# Patient Record
Sex: Male | Born: 1995 | Race: White | Hispanic: No | Marital: Single
Health system: Midwestern US, Community
[De-identification: ages and names within clinical notes are randomized; demographics above are authoritative.]

## PROBLEM LIST (undated history)

## (undated) DIAGNOSIS — F209 Schizophrenia, unspecified: Secondary | ICD-10-CM

## (undated) DIAGNOSIS — G8929 Other chronic pain: Secondary | ICD-10-CM

## (undated) DIAGNOSIS — F1210 Cannabis abuse, uncomplicated: Secondary | ICD-10-CM

## (undated) DIAGNOSIS — J45909 Unspecified asthma, uncomplicated: Secondary | ICD-10-CM

## (undated) HISTORY — DX: Unspecified asthma, uncomplicated: J45.909

## (undated) HISTORY — DX: Schizophrenia, unspecified: F20.9

## (undated) HISTORY — DX: Other chronic pain: G89.29

## (undated) HISTORY — DX: Cannabis abuse, uncomplicated: F12.10

## (undated) HISTORY — DX: Schizophrenia, unspecified (CMS HCC): F20.9

---
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
## 2015-06-04 ENCOUNTER — Ambulatory Visit (INDEPENDENT_AMBULATORY_CARE_PROVIDER_SITE_OTHER): Payer: BC Managed Care – PPO | Admitting: Physician Assistant

## 2015-06-04 ENCOUNTER — Encounter (INDEPENDENT_AMBULATORY_CARE_PROVIDER_SITE_OTHER): Payer: Self-pay | Admitting: Physician Assistant

## 2015-06-04 VITALS — BP 120/64 | HR 64 | Temp 98.2°F | Resp 16 | Ht 68.9 in | Wt 141.9 lb

## 2015-06-04 DIAGNOSIS — J069 Acute upper respiratory infection, unspecified: Secondary | ICD-10-CM

## 2015-06-04 DIAGNOSIS — J4520 Mild intermittent asthma, uncomplicated: Secondary | ICD-10-CM

## 2015-06-04 DIAGNOSIS — H1032 Unspecified acute conjunctivitis, left eye: Secondary | ICD-10-CM

## 2015-06-04 HISTORY — DX: Mild intermittent asthma, uncomplicated: J45.20

## 2015-06-04 MED ORDER — SULFACETAMIDE SODIUM 10 % OP SOLN
2.0000 [drp] | Freq: Four times a day (QID) | OPHTHALMIC | Status: DC
Start: ? — End: 2015-06-04

## 2015-06-04 MED ORDER — AZITHROMYCIN 250 MG PO TABS
ORAL_TABLET | ORAL | Status: DC
Start: ? — End: 2015-06-04

## 2015-06-04 NOTE — Progress Notes (Signed)
Subjective: Kirk Ward is a 20 y.o. male who presents today as a new patient.    Pt complains of the following problem(S): respiratory and eye symptoms.      Respiratory symptoms-   Onset of sx - 1 week ago  Head congestion - yes  Chest congestion - yes  Nasal congestion - yes  Runny nose - yes   Color - yellow  Sinus pressure - yes   Teeth hurt - no  Sore throat - slight discomfort from post nasal drip  Ear pain/pressure - no  Cough - yes  Sputum production - not really although slightly today   Color - yellow  Chest pain w/ coughing - not today although he did  Wheezing - no  Fever/chills - feverish feeling when symptoms started although temp not checked  Decreased appetite - no  Arthralgias - yes  Exposure to strep/flu - no  Others with similar symptoms - no  Meds used - Ibuprofen, Tylenol, Robitussin    Eye symptoms-   Onset of symptoms - 4 days ago  Affected eye - left  Vision changes - no  Sclera redness - yes  Drainage - yes   Color - mucous colored   Matting - yes  Foreign body sensation - no  Cold symptoms - yes; see above  Skin rash - no    PMH:    has a past medical history of Mild intermittent asthma without complication (06/04/2015).    PSH:   has no past surgical history on file.    Allergies:   No Known Allergies     Medications:   No current outpatient prescriptions on file.    Social Hx:    reports that he has never smoked. He has never used smokeless tobacco. He reports that he drinks alcohol. He reports that he does not use illicit drugs.    Family Hx:   family history includes No known problems in his brother, brother, father, mother, and sister.    ROS:   Gen: not feeling well  HEENT:  As above  Neck:  no complaints  Heart:  no complaints  Lungs:  As above  GI:  no complaints  GU:  no complaints  Ext:  no complaints  MS:  no complaints  Neuro:  no complaints  Psych:  no complaints    Objective: Vital signs: BP 120/64 mmHg  Pulse 64  Temp(Src) 98.2 F (36.8 C)  Resp 16  Ht 1.75 m (5' 8.9")   Wt 64.365 kg (141 lb 14.4 oz)  BMI 21.02 kg/m2  SpO2 97%    Gen: well nourished male in no acute distress  Head: atraumatic, normal cephalic  Eyes: Location - L eye    Description - Sclera red and injected.  PERRL.  EOMI.  No foreign bodies visible.     Location - R eye    Description - Sclera white.  PERRL.  EOMI.    Ears: canals patent, TM's B/L gray with diminished landmarks, full appearing.    Nose: Turbinates pink, slight swelling, no drainage  Mouth: no lesions or masses noted  Dentition: good  Throat: no lesions, no masses, no post nasal drip noted  Sinuses: non tender to palpation over the frontal and maxillary sinuses  Neck: supple, adenopathy noted, no thyroidmegaly or nodules noted, no JVD or bruits noted   Heart: regular, no murmurs or ectopy  Lungs: CTA, breath sounds probably normal for pt  Abd: soft, bowel sounds normal, non tender,  no organomegaly or masses palpable  Ext: no clubbing, no cyanosis, no edema  MS: no gross abnormalities noted  Neuro: A/O x 3; CN 2-12 grossly intact    Gait: steady  Psych: pleasant and cooperative    Assessment:  1. Upper respiratory tract infection, unspecified type    2. Acute bacterial conjunctivitis of left eye        Plan:  Start med(s) as listed below.    Patient instructed in med(s), side effects and s/s to call.    Patient instructed in symptomatic measures.    Patient instructed to call if no improvement.    Work excuse for 06-04-15 and 06-05-15    Patient instructed to call if any problems or concerns.  Patient asked if they had any questions or concerns.   Patient reported that all of their questions were answered and that they had no concerns.    Requested Prescriptions     Signed Prescriptions Disp Refills   . azithromycin (ZITHROMAX Z-PAK) 250 MG tablet 6 tablet 0     Sig: 2 po today then 1 po qd x 4 d   . sulfacetamide (BLEPH-10) 10 % ophthalmic solution 5 mL 0     Sig: Place 2 drops into the left eye 4 (four) times daily.       No orders of the  defined types were placed in this encounter.       There are no Patient Instructions on file for this visit.    Adjust med list to what pt is taking    Quality Measures:  Tobacco Dependence:  History   Smoking status   . Never Smoker    Smokeless tobacco   . Never Used     Counseling given: Not Answered    F/U:   Return if symptoms worsen or fail to improve.    Supervising physician - Dr. Memory Argue A. Lovelace, DO

## 2015-06-14 ENCOUNTER — Encounter (INDEPENDENT_AMBULATORY_CARE_PROVIDER_SITE_OTHER): Payer: Self-pay | Admitting: Physician Assistant

## 2015-06-14 ENCOUNTER — Ambulatory Visit (INDEPENDENT_AMBULATORY_CARE_PROVIDER_SITE_OTHER): Payer: BC Managed Care – PPO | Admitting: Physician Assistant

## 2015-06-14 VITALS — BP 116/62 | HR 90 | Temp 97.6°F | Resp 16 | Ht 68.9 in | Wt 145.4 lb

## 2015-06-14 DIAGNOSIS — Z0000 Encounter for general adult medical examination without abnormal findings: Secondary | ICD-10-CM

## 2015-06-14 LAB — VH POCT URINALYSIS (DIPSTICK)
Bilirubin, UA POCT: NEGATIVE
Blood, UA POCT: NEGATIVE
Glucose, UA POCT: NEGATIVE mg/dL
Ketones, UA POCT: NEGATIVE mg/dL
Nitrite, UA POCT: NEGATIVE
POCT Leukocytes, UA: NEGATIVE
POCT Spec Gravity, UA: 1.03 (ref 1.001–1.035)
POCT pH, UA: 6 (ref 5–8)
Protein, UA POCT: NEGATIVE mg/dL
Urobilinogen, UA: 0.2 mg/dL

## 2015-06-14 NOTE — Progress Notes (Signed)
Subjective: Kirk Ward is a 20 y.o. male who presents today for a physical.    Pt denies any problems     PMH:    has a past medical history of Mild intermittent asthma without complication (06/04/2015).    PSH:   has no past surgical history on file.    Allergies:   No Known Allergies     Medications:   No current outpatient prescriptions on file.    Social Hx:    reports that he has never smoked. He has never used smokeless tobacco. He reports that he drinks alcohol. He reports that he does not use illicit drugs.    Family Hx:   family history includes No known problems in his brother, brother, father, mother, and sister.    ROS:  Gen: no complaints  HEENT:  no complaints  Neck:  no complaints  Heart:  no complaints  Lungs:  no complaints  GI:  no complaints  GU:  no complaints  Ext:  no complaints  MS:  no complaints  Skin:  no complaints  Neuro:  no complaints  Psych:  no complaints    Objective: Vital signs: BP 116/62 mmHg  Pulse 90  Temp(Src) 97.6 F (36.4 C) (Oral)  Resp 16  Ht 1.75 m (5' 8.9")  Wt 65.953 kg (145 lb 6.4 oz)  BMI 21.54 kg/m2  SpO2 99%     Visual Acuity Screening    Right eye Left eye Both eyes   Without correction: 20/13 20/13 20/13    With correction:          Gen: Well nourished male in no acute distress  Head: atraumatic, normal cephalic, no abnormality noted  Eyes: sclera white, EOMI intact  Ears: canals patent, tm's pearl gray  Nose: patent  Mouth: no lesions or masses noted  Dentition: good  Throat: patent, no postnasal drip noted  Neck: supple, no adenopathy, no thyroidmegaly or masses noted, no JVD or bruits noted  Heart: regular, no murmurs or ectopy  Lungs: CTA, breath sounds normal for pt  Chest: symmetrical, no use of accessory muscles  Abd: soft, bowel sounds normal, non tender, no organomegaly or masses palpable  Ext: no clubbing cyanosis or edema  MS: no gross abnormalities noted  Neuro: A/O x 3; CN 2-12 grossly intact   Gait: steady  Psych: pleasant and  cooperative    Recent Results (from the past 2 hour(s))   POCT UA Dipstick    Collection Time: 06/14/15  9:54 AM   Result Value Ref Range    Glucose, UA POCT Negative Negative mg/dL    Bilirubin, UA POCT Negative Negative    Ketones, UA POCT Negative Negative mg/dL    POCT Spec Gravity, UA 1.030 1.001 - 1.035    Blood, UA POCT Negative Negative, Trace    POCT pH, UA 6.0 5 - 8    Protein, UA POCT Negative Negative mg/dL    Urobilinogen, UA 0.2 0.2, 1.0, 2.0 mg/dL    Nitrite, UA POCT Negative Negative    POCT Leukocytes, UA Negative Negative     Assessment:  1. Routine physical examination        Plan:  Reviewed UA with patient today.    Patient voiced understanding of results.    Discussed healthy diet, exercise, weight control.      Patient instructed that they need labs soon and that we will call them with the results - pt agreed.   Patient instructed to call the office  if they had not heard about the results in 1 week.      Patient instructed to call if any problems or concerns.  Patient asked if they had any questions or concerns.   Patient reported that all of their questions were answered and that they had no concerns.    Requested Prescriptions      No prescriptions requested or ordered in this encounter       Orders Placed This Encounter   Procedures   . Comprehensive metabolic panel     Standing Status: Future      Number of Occurrences:       Standing Expiration Date: 06/13/2016     Order Specific Question:  Has the patient fasted?     Answer:  Yes   . Lipid panel     Standing Status: Future      Number of Occurrences:       Standing Expiration Date: 06/13/2016     Order Specific Question:  Has the patient fasted?     Answer:  Yes   . CBC and differential     Standing Status: Future      Number of Occurrences:       Standing Expiration Date: 06/13/2016   . TSH     Standing Status: Future      Number of Occurrences:       Standing Expiration Date: 06/13/2016   . Vitamin D,25 OH, Total     Standing Status: Future       Number of Occurrences:       Standing Expiration Date: 06/13/2016   . POCT UA Dipstick       There are no Patient Instructions on file for this visit.    Quality Measures:  Depression Follow-up:   depression follow-up done - screening negative  Nutrition and Physical Activity Counseling:  Nutrition Counseling:   Food education, guidance and counseling  Physical Activity Counseling   Patient advised about exercise  Tobacco Dependence:  History   Smoking status   . Never Smoker    Smokeless tobacco   . Never Used     Counseling given: Not Answered    F/U:   Return in about 1 year (around 06/13/2016) for physical.    Supervising physician - Dr. Memory Argue A. Lovelace, DO

## 2015-07-01 ENCOUNTER — Encounter (INDEPENDENT_AMBULATORY_CARE_PROVIDER_SITE_OTHER): Payer: Self-pay | Admitting: Physician Assistant

## 2015-07-01 DIAGNOSIS — J302 Other seasonal allergic rhinitis: Secondary | ICD-10-CM

## 2015-07-01 HISTORY — DX: Other seasonal allergic rhinitis: J30.2

## 2016-01-14 ENCOUNTER — Encounter (INDEPENDENT_AMBULATORY_CARE_PROVIDER_SITE_OTHER): Payer: Self-pay | Admitting: Physician Assistant

## 2016-01-14 ENCOUNTER — Ambulatory Visit (INDEPENDENT_AMBULATORY_CARE_PROVIDER_SITE_OTHER): Payer: BC Managed Care – PPO | Admitting: Physician Assistant

## 2016-01-14 ENCOUNTER — Other Ambulatory Visit
Admission: RE | Admit: 2016-01-14 | Discharge: 2016-01-14 | Disposition: A | Discharge status: Home / Self Care | Payer: BC Managed Care – PPO | Source: Ambulatory Visit | Attending: Physician Assistant | Admitting: Physician Assistant

## 2016-01-14 DIAGNOSIS — R1030 Lower abdominal pain, unspecified: Secondary | ICD-10-CM

## 2016-01-14 DIAGNOSIS — J069 Acute upper respiratory infection, unspecified: Secondary | ICD-10-CM

## 2016-01-14 LAB — VH POCT URINALYSIS (DIPSTICK)
Bilirubin, UA POCT: NEGATIVE
Blood, UA POCT: NEGATIVE
Glucose, UA POCT: NEGATIVE mg/dL
Ketones, UA POCT: NEGATIVE mg/dL
Nitrite, UA POCT: NEGATIVE
POCT Leukocytes, UA: NEGATIVE
POCT Spec Gravity, UA: 1.01 (ref 1.001–1.035)
POCT pH, UA: 8 (ref 5–8)
Protein, UA POCT: NEGATIVE mg/dL
Urobilinogen, UA: 0.2 mg/dL

## 2016-01-14 MED ORDER — AZITHROMYCIN 250 MG PO TABS
ORAL_TABLET | ORAL | Status: DC
Start: ? — End: 2016-01-14

## 2016-01-14 NOTE — Progress Notes (Signed)
Subjective: Kirk Ward is a 20 y.o. male who presents today with respiratory symptoms and a GU complaint.    Respiratory symptoms-   Onset of symptoms - 1 week ago  Head congestion - no  Chest congestion - yes  Nasal congestion - slightly  Runny nose - no  Sinus pressure - no  Sore throat - yes but only when the symptoms first started  Ear pain/pressure - no  Cough - yes   Sputum production - occasionally    Color - "pinkish/tannish"   Chest pain w/ coughing - not usually  Wheezing - no  Fever/chills - no  Decreased appetite - no  Arthralgias - not other than a soreness in the testicle when he is coughing  Exposure to strep/flu - no  Others with similar symptoms - no  Med(s) used - Tylenol chest decongestant    GU symptoms-   Onset of symptoms - several days ago  Urinary symptoms - yes   Frequency - no   Urgency - no   Dysuria - no although it is "different"   Hematuria - no   Stream not as forceful - no  Testicular/Penile symptoms - yes   Pain - yes    Location - B/L testicles and leg creases    Constant - yes    Type of pain - aching, soreness    Radiation of pain - yes; from the testicles to the leg creases    What makes pain worse - coughing    What makes pain better - no    On a pain scale from 0 to 10 [0 = no pain, 10 = severe pain] patient puts their pain at a 3-4   Discharge - occasionally    Color - clear  Suprapubic pressure - no  Nausea - no  Vomiting - no  Fevers - no  Treatment(s) tried - no  History of similar symptoms - no  Sexual activity - states that his last sexual encounter was about 3 months ago.  Had been with that partner only for the night.  Was wearing a condom.       PMH, FH, SH:  Reviewed and updated as needed.      ROS:  Gen: no complaints  HEENT: as above  Heart: no complaints  Lungs: as above  GI: no complaints  GU: as above  MS: no complaints  Neuro: no complaints  Psych: no complaints    Objective: Vital signs - BP 140/72 mmHg  Pulse 56  Temp(Src) 97.8 F (36.6 C) (Tympanic)   Resp 18  Ht 1.75 m (5' 8.9")  Wt 66.18 kg (145 lb 14.4 oz)  BMI 21.61 kg/m2  SpO2 98%   Repeat BP 120/90, repeat P 84    Gen: Well nourished male in no acute distress  Head: NCAT.  No abnormalities noted.    Eyes: EOMI.    Ears: TMs B/L gray with good landmarks.    Nose: Turbinates pink, no swelling, no drainage.    Throat: Pharynx pink, no exudates, no PND.    Sinuses: non tender to palpation over the frontal and maxillary sinuses  Heart: regular, no murmurs or ectopy  Lungs: CTA, breath sounds normal for pt  Abd: soft, bowel sounds normal, non tender, no organomegaly or masses palpable, no guarding or rebound noted   No CVA tenderness  GU: With chaperone present -    Urethral meatus - normal, no lesions/masses visible   Testicles - nearly symmetrical height,  no swelling, no masses   Inguinal canals - no hernias present  Neuro: Alert and interactive; CN 2-12 grossly intact   Gait - steady  Psych: pleasant and cooperative    Recent Results (from the past 2 hour(s))   POCT UA Dipstick    Collection Time: 01/14/16  9:35 AM   Result Value Ref Range    Glucose, UA POCT Negative Negative mg/dL    Bilirubin, UA POCT Negative Negative    Ketones, UA POCT Negative Negative mg/dL    POCT Spec Gravity, UA 1.010 1.001 - 1.035    Blood, UA POCT Negative Negative, Trace    POCT pH, UA 8.0 5 - 8    Protein, UA POCT Negative Negative mg/dL    Urobilinogen, UA 0.2 0.2, 1.0, 2.0 mg/dL    Nitrite, UA POCT Negative Negative    POCT Leukocytes, UA Negative Negative     Assessment:  1. Upper respiratory tract infection, unspecified type    2. Groin pain, unspecified laterality        Plan:  Start med(s) as listed below.    Patient instructed in med(s), side effects and s/s to call.    Patient instructed in symptomatic measures.      Reviewed 01-14-16 labs with patient today - POCT UA WNL.    Patient voiced understanding of results.  Patient instructed that they need urine STD exam today and that we will call them with the results -  pt agreed.   Patient instructed to call the office if they had not heard about the results in 1 week.    Patient instructed to continue to monitor his symptoms and return to the office if there is no improvement.      Patient instructed to call if no improvement.    Patient instructed to call if any problems or concerns.  Patient asked if they had any questions or concerns.   Patient reported that all of their questions were answered and that they had no concerns.    Requested Prescriptions     Signed Prescriptions Disp Refills   . azithromycin (ZITHROMAX Z-PAK) 250 MG tablet 6 tablet 0     Sig: 2 po today then 1 po qd x 4 d       Orders Placed This Encounter   Procedures   . STD Amplified DNA Probe     Standing Status: Future      Number of Occurrences:       Standing Expiration Date: 01/13/2017     Order Specific Question:  Specimen     Answer:  Urine   . POCT UA Dipstick       There are no Patient Instructions on file for this visit.    Adjust med list to what patient is taking.     Quality Measures:  Tobacco Dependence:  History   Smoking status   . Never Smoker    Smokeless tobacco   . Never Used     Counseling given: Not Answered    F/U:   Return if symptoms worsen or fail to improve.    Supervising physician - Dr. Memory Argue A. Lovelace, DO

## 2016-01-15 LAB — VH STD AMPLIFIED DNA PROBE
Chlamydia trachomatis: NEGATIVE
Neisseria gonorrhoeae: NEGATIVE

## 2016-04-21 ENCOUNTER — Ambulatory Visit: Payer: BC Managed Care – PPO | Attending: Physician Assistant | Admitting: Physician Assistant

## 2016-04-21 ENCOUNTER — Encounter (RURAL_HEALTH_CENTER): Payer: Self-pay | Admitting: Physician Assistant

## 2016-04-21 VITALS — BP 128/72 | HR 62 | Temp 98.4°F | Resp 18 | Ht 68.9 in | Wt 142.8 lb

## 2016-04-21 DIAGNOSIS — M542 Cervicalgia: Secondary | ICD-10-CM

## 2016-04-21 DIAGNOSIS — G8929 Other chronic pain: Secondary | ICD-10-CM

## 2016-04-21 DIAGNOSIS — M546 Pain in thoracic spine: Secondary | ICD-10-CM

## 2016-04-21 MED ORDER — CYCLOBENZAPRINE HCL 10 MG PO TABS
10.0000 mg | ORAL_TABLET | Freq: Every evening | ORAL | 0 refills | Status: AC | PRN
Start: 2016-04-21 — End: 2017-04-21

## 2016-04-21 MED ORDER — NAPROXEN 500 MG PO TABS
500.0000 mg | ORAL_TABLET | Freq: Two times a day (BID) | ORAL | 0 refills | Status: AC | PRN
Start: 2016-04-21 — End: ?

## 2016-04-21 NOTE — Progress Notes (Signed)
Subjective: Kirk Ward is a 20 y.o. male who presents with a complaint of pain.      Pain-   Onset of pain - 4 days ago although has had intermittent symptoms over the past 7 years  Location - under the right shoulder blade and under the anterior left ribs  Constant - yes  Type of pain - tightness most of the time although a sharp pain occasionally  Radiation of pain - not usually although occasionally the pain under the right shoulder blade will extend into the neck  Associated symptoms   Numbness/tingling - no   Weakness - no  On a pain scale from 0 to 10 [0 = no pain, 10 = severe pain] patient puts their pain at a 7  What makes pain worse - sneezing, coughing, certain arm movements  What makes pain better - nothing  Treatment(s) tried - nothing  History of trauma - no although the time the pain did occur after he sneezed  History of same pain - yes    PMH, FH, SH:  Reviewed and updated as needed.      ROS:  Gen: no complaints  Heart: no complaints  Lungs: no complaints  MS:  As above  Neuro: as above  Psych: no complaints    Objective: Vital Signs - BP 128/72 (BP Site: Right arm, Patient Position: Sitting, Cuff Size: Medium)   Pulse 62   Temp 98.4 F (36.9 C) (Tympanic)   Resp 18   Ht 1.75 m (5' 8.9")   Wt 64.8 kg (142 lb 12.8 oz)   SpO2 99%   BMI 21.15 kg/m     Gen: Well nourished male in no acute distress  Heart: regular, no murmurs or ectopy  Lungs: CTA, breath sounds normal for patient   Chest: symmetrical, no use of accessory muscles  Ext: no clubbing, no cyanosis, no edema  MS: location - cervical spine, thoracic spine, B/L paraspinal muscles   Tenderness to palpation over the upper thoracic spine, right lateral neck muscles and B/L thoracic paraspinal muscles   ROM - good neck and back ROM although pain present with spinal extension, left lateral rotation, neck flexion/extension, right/left lateral neck rotation   Neuro:  Alert and interactive; CN 2-12 grossly intact   Gait - steady  Psych:  pleasant and cooperative    Assessment:  1. Chronic bilateral thoracic back pain    2. Neck pain        Plan:  Start med(s) as listed below.    Patient instructed in med(s), side effects and s/s to call.     Patient instructed that he should not drive or operate machinery while taking Cyclobenzaprine - patient verbalized understanding and agreement.    Patient instructed in symptomatic measures.    Patient instructed to call if no improvement.      Patient instructed to call if any problems or concerns.  Patient asked if they had any questions or concerns.   Patient reported that all of their questions were answered and that they had no concerns.    Requested Prescriptions     Signed Prescriptions Disp Refills   . naproxen (NAPROSYN) 500 MG tablet 60 tablet 0     Sig: Take 1 tablet (500 mg total) by mouth 2 (two) times daily as needed (pain).Take with food.   . cyclobenzaprine (FLEXERIL) 10 MG tablet 30 tablet 0     Sig: Take 1 tablet (10 mg total) by mouth nightly as needed (muscle  pain).       No orders of the defined types were placed in this encounter.      There are no Patient Instructions on file for this visit.    Adjust med list to what patient is taking.     Quality Measures:  Tobacco Dependence:  History   Smoking Status   . Never Smoker   Smokeless Tobacco   . Never Used     Counseling given: Not Answered    F/U:   Return if symptoms worsen or fail to improve.    Supervising physician - Dr. Memory Argue A. Lovelace, DO

## 2019-03-13 DIAGNOSIS — N341 Nonspecific urethritis: Secondary | ICD-10-CM | POA: Diagnosis not present

## 2019-11-04 ENCOUNTER — Inpatient Hospital Stay
Admit: 2019-11-04 | Discharge: 2019-11-04 | Disposition: A | Discharge status: Home / Self Care | Payer: BLUE CROSS/BLUE SHIELD | Attending: Emergency Medicine

## 2019-11-04 DIAGNOSIS — F411 Generalized anxiety disorder: Secondary | ICD-10-CM

## 2019-11-04 DIAGNOSIS — F489 Nonpsychotic mental disorder, unspecified: Secondary | ICD-10-CM | POA: Diagnosis not present

## 2019-11-04 DIAGNOSIS — F17200 Nicotine dependence, unspecified, uncomplicated: Secondary | ICD-10-CM | POA: Diagnosis not present

## 2019-11-04 DIAGNOSIS — Z79899 Other long term (current) drug therapy: Secondary | ICD-10-CM | POA: Diagnosis not present

## 2019-11-04 LAB — DRUG SCREEN, URINE
AMPHETAMINES: NEGATIVE
Amphetamine Screen, Urine: NEGATIVE
BARBITURATES: NEGATIVE
BENZODIAZEPINES: NEGATIVE
Barbiturate Screen, Urine: NEGATIVE
Benzodiazepine Screen, Urine: NEGATIVE
COCAINE: NEGATIVE
Cocaine Screen Urine: NEGATIVE
METHADONE: NEGATIVE
Methadone Screen, Urine: NEGATIVE
OPIATES: NEGATIVE
Opiate Screen, Urine: NEGATIVE
PCP Screen, Urine: NEGATIVE
PCP(PHENCYCLIDINE): NEGATIVE
THC (TH-CANNABINOL): POSITIVE — AB
THC Screen, Urine: POSITIVE — AB

## 2019-11-04 LAB — URINALYSIS W/ RFLX MICROSCOPIC
Bilirubin, Urine: NEGATIVE
Bilirubin: NEGATIVE
Blood, Urine: NEGATIVE
Blood: NEGATIVE
Glucose, Ur: NEGATIVE mg/dL
Glucose: NEGATIVE mg/dL
Ketone: NEGATIVE mg/dL
Ketones, Urine: NEGATIVE mg/dL
Leukocyte Esterase, Urine: NEGATIVE
Leukocyte Esterase: NEGATIVE
Nitrite, Urine: NEGATIVE
Nitrites: NEGATIVE
Protein, UA: NEGATIVE mg/dL
Protein: NEGATIVE mg/dL
Specific Gravity, UA: 1.012 (ref 1.003–1.030)
Specific gravity: 1.012 (ref 1.003–1.030)
Urobilinogen, UA, POCT: 0.2 EU/dL (ref 0.2–1.0)
Urobilinogen: 0.2 EU/dL (ref 0.2–1.0)
pH (UA): 8 (ref 5.0–8.0)
pH, UA: 8 (ref 5.0–8.0)

## 2019-11-04 NOTE — ED Provider Notes (Signed)
HPI patient is a 24 year old male with past medical history significant for asthma and reactive airway disease who presents to the ED accompanied by his mother who is concerned that her son is not physically or mentally healthy.  She states that she lives in [place] but came up to have him evaluated.  He lives here with 3 other roommates in an apartment and is presently not employed.  Mother is concerned when talking with him that he is having hallucinations or delusional thoughts. Hand-eye coordination  is intact; normal reflexes; normal gait.  He denies any suicidal or homicidal thoughts.  He has not had any medications today prior to arrival.    Past Medical History:   Diagnosis Date   ??? Asthma    ??? Reactive airway disease        No past surgical history on file.      Family History:   Problem Relation Age of Onset   ??? Alcohol abuse Neg Hx    ??? Arthritis-osteo Neg Hx    ??? Asthma Neg Hx    ??? Bleeding Prob Neg Hx    ??? Cancer Neg Hx    ??? Diabetes Neg Hx    ??? Elevated Lipids Neg Hx    ??? Headache Neg Hx    ??? Heart Disease Neg Hx    ??? Hypertension Neg Hx    ??? Lung Disease Neg Hx    ??? Migraines Neg Hx    ??? Psychiatric Disorder Neg Hx    ??? Stroke Neg Hx    ??? Mental Retardation Neg Hx        Social History     Socioeconomic History   ??? Marital status: SINGLE     Spouse name: Not on file   ??? Number of children: Not on file   ??? Years of education: Not on file   ??? Highest education level: Not on file   Occupational History   ??? Not on file   Tobacco Use   ??? Smoking status: Never Smoker   Substance and Sexual Activity   ??? Alcohol use: No   ??? Drug use: No   ??? Sexual activity: Never   Other Topics Concern   ??? Not on file   Social History Narrative   ??? Not on file     Social Determinants of Health     Financial Resource Strain:    ??? Difficulty of Paying Living Expenses:    Food Insecurity:    ??? Worried About Programme researcher, broadcasting/film/video in the Last Year:    ??? Barista in the Last Year:    Transportation Needs:    ??? Automotive engineer (Medical):    ??? Lack of Transportation (Non-Medical):    Physical Activity:    ??? Days of Exercise per Week:    ??? Minutes of Exercise per Session:    Stress:    ??? Feeling of Stress :    Social Connections:    ??? Frequency of Communication with Friends and Family:    ??? Frequency of Social Gatherings with Friends and Family:    ??? Attends Religious Services:    ??? Database administrator or Organizations:    ??? Attends Engineer, structural:    ??? Marital Status:    Intimate Programme researcher, broadcasting/film/video Violence:    ??? Fear of Current or Ex-Partner:    ??? Emotionally Abused:    ??? Physically Abused:    ???  Sexually Abused:          ALLERGIES: Patient has no known allergies.    Review of Systems   Constitutional: Negative for activity change, appetite change, fever and unexpected weight change.   HENT: Negative for congestion, facial swelling and trouble swallowing.    Eyes: Negative for visual disturbance.   Respiratory: Negative for cough and shortness of breath.    Cardiovascular: Negative for chest pain, palpitations and leg swelling.   Gastrointestinal: Negative for abdominal pain, diarrhea, nausea and vomiting.   Genitourinary: Negative for dysuria, flank pain and frequency.   Musculoskeletal: Negative for back pain and neck pain.   Skin: Negative for rash.   Neurological: Negative for dizziness, light-headedness and headaches.   Psychiatric/Behavioral: Negative for self-injury. The patient is not nervous/anxious.    All other systems reviewed and are negative.      Vitals:    11/04/19 1348 11/04/19 1403   BP: (!) 146/94    Pulse: 71    Resp: 16    Temp: 98.4 ??F (36.9 ??C)    SpO2: 100% 98%            Physical Exam  Vitals and nursing note reviewed.   Constitutional:       General: He is not in acute distress.     Appearance: Normal appearance. He is not ill-appearing, toxic-appearing or diaphoretic.      Comments: Black male; smoker; currently unemployed   HENT:      Head: Normocephalic.      Nose: Nose normal.       Mouth/Throat:      Mouth: Mucous membranes are moist.      Pharynx: No posterior oropharyngeal erythema.   Cardiovascular:      Rate and Rhythm: Normal rate and regular rhythm.   Pulmonary:      Effort: Pulmonary effort is normal.      Breath sounds: Normal breath sounds.   Abdominal:      General: Bowel sounds are normal.      Palpations: Abdomen is soft.      Tenderness: There is no abdominal tenderness.   Musculoskeletal:         General: Normal range of motion.      Cervical back: Normal range of motion and neck supple.   Lymphadenopathy:      Cervical: No cervical adenopathy.   Skin:     General: Skin is warm and dry.      Findings: No rash.   Neurological:      General: No focal deficit present.      Mental Status: He is alert and oriented to person, place, and time.   Psychiatric:         Mood and Affect: Mood normal.         Behavior: Behavior normal.          MDM       Procedures    Labs Reviewed   DRUG SCREEN, URINE - Abnormal; Notable for the following components:       Result Value    THC (TH-CANNABINOL) Positive (*)     All other components within normal limits   URINALYSIS W/ RFLX MICROSCOPIC - Abnormal; Notable for the following components:    Appearance CLOUDY (*)     All other components within normal limits         [facility] consult and evaluation; recommends outpatient follow-up and treatment.     Patient denies any suicidal or homicidal ideations.  2:46 PM  Patient's results and plan of care have been reviewed with him .  Patient and/or family have verbally conveyed their understanding and agreement of the patient's signs, symptoms, diagnosis, treatment and prognosis and additionally agree to follow up as recommended or return to the Emergency Room should his condition change prior to follow-up.  Discharge instructions have also been provided to the patient with some educational information regarding his diagnosis as well a list of reasons why he would want to return to the ER prior to his follow-up  appointment should his condition change.Yetta Numbers, NP
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
## 2019-11-04 NOTE — ED Notes (Signed)
[facility] states patient does not require a sitter.

## 2019-12-18 DIAGNOSIS — F3289 Other specified depressive episodes: Secondary | ICD-10-CM | POA: Diagnosis not present

## 2019-12-21 DIAGNOSIS — F3289 Other specified depressive episodes: Secondary | ICD-10-CM | POA: Diagnosis not present

## 2020-01-09 DIAGNOSIS — F3289 Other specified depressive episodes: Secondary | ICD-10-CM | POA: Diagnosis not present

## 2020-01-16 DIAGNOSIS — F3289 Other specified depressive episodes: Secondary | ICD-10-CM | POA: Diagnosis not present

## 2020-01-22 DIAGNOSIS — F3289 Other specified depressive episodes: Secondary | ICD-10-CM | POA: Diagnosis not present

## 2020-02-01 DIAGNOSIS — F209 Schizophrenia, unspecified: Secondary | ICD-10-CM | POA: Diagnosis not present

## 2020-02-06 DIAGNOSIS — F3289 Other specified depressive episodes: Secondary | ICD-10-CM | POA: Diagnosis not present

## 2020-02-13 DIAGNOSIS — F3289 Other specified depressive episodes: Secondary | ICD-10-CM | POA: Diagnosis not present

## 2020-02-20 DIAGNOSIS — F3289 Other specified depressive episodes: Secondary | ICD-10-CM | POA: Diagnosis not present

## 2020-03-02 ENCOUNTER — Other Ambulatory Visit: Payer: Self-pay

## 2020-03-02 ENCOUNTER — Encounter (HOSPITAL_COMMUNITY): Payer: Self-pay

## 2020-03-02 ENCOUNTER — Emergency Department (EMERGENCY_DEPARTMENT_HOSPITAL): Admission: EM | Admit: 2020-03-02 | Discharge: 2020-03-02 | Payer: BC Managed Care – PPO | Source: Home / Self Care

## 2020-03-02 DIAGNOSIS — Z62810 Personal history of physical and sexual abuse in childhood: Secondary | ICD-10-CM | POA: Diagnosis present

## 2020-03-02 DIAGNOSIS — Z560 Unemployment, unspecified: Secondary | ICD-10-CM

## 2020-03-02 DIAGNOSIS — R4182 Altered mental status, unspecified: Secondary | ICD-10-CM

## 2020-03-02 DIAGNOSIS — F209 Schizophrenia, unspecified: Secondary | ICD-10-CM | POA: Insufficient documentation

## 2020-03-02 DIAGNOSIS — Z5329 Procedure and treatment not carried out because of patient's decision for other reasons: Secondary | ICD-10-CM

## 2020-03-02 DIAGNOSIS — Z818 Family history of other mental and behavioral disorders: Secondary | ICD-10-CM

## 2020-03-02 DIAGNOSIS — Z008 Encounter for other general examination: Secondary | ICD-10-CM

## 2020-03-02 DIAGNOSIS — Z9119 Patient's noncompliance with other medical treatment and regimen: Secondary | ICD-10-CM

## 2020-03-02 DIAGNOSIS — Z62811 Personal history of psychological abuse in childhood: Secondary | ICD-10-CM | POA: Diagnosis present
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
## 2020-03-02 NOTE — ED Triage Notes (Signed)
Pt arrived ambulatory stating "my mother thinks I'm crazy, but she just doesn't get me. I rap and they interfere with my mind and interrupt my music. " Pt denies any SI or HI. Denies any hallucinations. Mother states he was recently diagnosed with schizophrenia, patient denies.

## 2020-03-03 ENCOUNTER — Other Ambulatory Visit: Payer: Self-pay

## 2020-03-03 ENCOUNTER — Encounter (HOSPITAL_COMMUNITY): Payer: Self-pay

## 2020-03-03 ENCOUNTER — Inpatient Hospital Stay (HOSPITAL_COMMUNITY): Payer: BC Managed Care – PPO | Admitting: PSYCHIATRY

## 2020-03-03 ENCOUNTER — Inpatient Hospital Stay
Admission: AD | Admit: 2020-03-03 | Discharge: 2020-03-04 | DRG: 885 | Disposition: A | Discharge status: Other Acute Inpatient Hospital | Payer: BC Managed Care – PPO | Attending: PSYCHIATRY | Admitting: PSYCHIATRY

## 2020-03-03 DIAGNOSIS — Z62811 Personal history of psychological abuse in childhood: Secondary | ICD-10-CM | POA: Diagnosis present

## 2020-03-03 DIAGNOSIS — Z62810 Personal history of physical and sexual abuse in childhood: Secondary | ICD-10-CM | POA: Diagnosis present

## 2020-03-03 DIAGNOSIS — Z9119 Patient's noncompliance with other medical treatment and regimen: Secondary | ICD-10-CM

## 2020-03-03 DIAGNOSIS — Z560 Unemployment, unspecified: Secondary | ICD-10-CM

## 2020-03-03 DIAGNOSIS — Z046 Encounter for general psychiatric examination, requested by authority: Secondary | ICD-10-CM

## 2020-03-03 DIAGNOSIS — Z818 Family history of other mental and behavioral disorders: Secondary | ICD-10-CM

## 2020-03-03 DIAGNOSIS — F209 Schizophrenia, unspecified: Principal | ICD-10-CM

## 2020-03-03 DIAGNOSIS — F29 Unspecified psychosis not due to a substance or known physiological condition: Secondary | ICD-10-CM | POA: Diagnosis not present

## 2020-03-03 DIAGNOSIS — Z5329 Procedure and treatment not carried out because of patient's decision for other reasons: Secondary | ICD-10-CM | POA: Diagnosis not present

## 2020-03-03 DIAGNOSIS — R4182 Altered mental status, unspecified: Secondary | ICD-10-CM | POA: Diagnosis not present

## 2020-03-03 LAB — CBC WITH DIFF
BASOPHIL #: 0.1 10*3/uL (ref 0.00–0.30)
BASOPHIL %: 1 % (ref 0–1)
EOSINOPHIL #: 0.4 10*3/uL (ref 0.00–0.50)
EOSINOPHIL %: 4 % (ref 0–4)
HCT: 42.9 % (ref 42.0–52.0)
HGB: 14.6 g/dL (ref 13.5–18.0)
LYMPHOCYTE #: 2.9 10*3/uL (ref 0.90–4.80)
LYMPHOCYTE %: 29 % (ref 23–35)
MCH: 31.6 pg (ref 28.0–33.0)
MCHC: 33.9 g/dL (ref 32.0–37.0)
MCV: 93.4 fL (ref 78.0–100.0)
MONOCYTE #: 0.8 10*3/uL (ref 0.30–0.90)
MONOCYTE %: 8 % (ref 0–12)
NEUTROPHIL #: 5.8 10*3/uL (ref 1.70–7.00)
NEUTROPHIL %: 58 % (ref 50–70)
PLATELETS: 176 10*3/uL (ref 130–400)
RBC: 4.6 10*6/uL — ABNORMAL LOW (ref 4.70–5.40)
RDW: 13 % (ref 9.9–16.5)
WBC: 10.1 10*3/uL (ref 4.5–11.0)

## 2020-03-03 LAB — COMPREHENSIVE METABOLIC PANEL, NON-FASTING
ALBUMIN: 4.4 g/dL (ref 3.5–5.0)
ALKALINE PHOSPHATASE: 62 U/L (ref 45–115)
ALT (SGPT): 14 U/L (ref 10–55)
ANION GAP: 10 mmol/L (ref 4–13)
AST (SGOT): 18 U/L (ref 8–45)
BILIRUBIN TOTAL: 0.5 mg/dL (ref 0.3–1.3)
BUN/CREA RATIO: 12 (ref 6–22)
BUN: 10 mg/dL (ref 8–25)
CALCIUM: 9.9 mg/dL (ref 8.5–10.0)
CHLORIDE: 103 mmol/L (ref 96–111)
CO2 TOTAL: 26 mmol/L (ref 22–30)
CREATININE: 0.85 mg/dL (ref 0.75–1.35)
ESTIMATED GFR: >90 mL/min/BSA (ref >=60)
GLUCOSE: 99 mg/dL (ref 65–125)
POTASSIUM: 4 mmol/L (ref 3.5–5.1)
PROTEIN TOTAL: 7.3 g/dL (ref 6.4–8.3)
SODIUM: 139 mmol/L (ref 136–145)

## 2020-03-03 LAB — ACETAMINOPHEN LEVEL: ACETAMINOPHEN LEVEL: <5 ug/mL — ABNORMAL LOW (ref 10–30)

## 2020-03-03 LAB — COVID-19 ~~LOC~~ MOLECULAR LAB TESTING
INFLUENZA VIRUS TYPE A: NOT DETECTED
INFLUENZA VIRUS TYPE B: NOT DETECTED
RESPIRATORY SYNCTIAL VIRUS (RSV): NOT DETECTED
SARS-CoV-2: NOT DETECTED

## 2020-03-03 LAB — ETHANOL, SERUM: ETHANOL: NOT DETECTED

## 2020-03-03 LAB — SALICYLATE ACID LEVEL: SALICYLATE LEVEL: <5 mg/dL — ABNORMAL LOW (ref 15–30)

## 2020-03-03 NOTE — ED Nurses Note (Signed)
pt states that he is the son of god, questions myself on my relationship with god, multiple attempts to redirect pt to the tasks needing to be preformed. Pt was not able to be redirected and would continue to ask me about god and spirituality. Pt was hesitant to let me do a blood draw and covid test but did comply. While preforming blood draw pt started to chant to himself in unknown language. Pt states that he will not accept any fluid from me into his body.
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
## 2020-03-03 NOTE — ED Nurses Note (Signed)
changed into hospital clothing

## 2020-03-03 NOTE — ED Nurses Note (Signed)
pt report called to Brandy RN. pt to be transported to Behavioral Health Unit
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
## 2020-03-03 NOTE — ED Nurses Note (Signed)
[employer] police are on pt watch

## 2020-03-03 NOTE — ED Triage Notes (Signed)
Brought to ED with police officer.  Per police officer patient is being brought to ED for involuntary.  Patient denies any SI or HI ideations at this time.      Sitting in triage.  Aox3.  Respirations are regular with no distress noted

## 2020-03-04 ENCOUNTER — Encounter (HOSPITAL_COMMUNITY): Payer: Self-pay | Admitting: PSYCHIATRY

## 2020-03-04 DIAGNOSIS — F29 Unspecified psychosis not due to a substance or known physiological condition: Secondary | ICD-10-CM

## 2020-03-04 LAB — TRICYCLIC SCREEN: TRICYCLICS SCREEN BLOOD: <40 ng/mL

## 2020-03-04 MED ORDER — ALUMINUM-MAG HYDROXIDE-SIMETHICONE 200 MG-200 MG-20 MG/5 ML ORAL SUSP
5.0000 mL | ORAL | Status: DC | PRN
Start: 2020-03-04 — End: 2020-03-04

## 2020-03-04 MED ORDER — DIPHENHYDRAMINE 25 MG CAPSULE
25.0000 mg | ORAL_CAPSULE | Freq: Every evening | ORAL | Status: DC | PRN
Start: 2020-03-04 — End: 2020-03-04

## 2020-03-04 MED ORDER — OLANZAPINE 10 MG TABLET
10.0000 mg | ORAL_TABLET | Freq: Every day | ORAL | Status: DC
Start: 2020-03-04 — End: 2020-03-04
  Administered 2020-03-04: 0 mg via ORAL

## 2020-03-04 MED ORDER — PROMETHAZINE 25 MG TABLET
25.0000 mg | ORAL_TABLET | Freq: Four times a day (QID) | ORAL | Status: DC | PRN
Start: 2020-03-04 — End: 2020-03-04

## 2020-03-04 MED ORDER — DOCUSATE SODIUM 100 MG CAPSULE
100.0000 mg | ORAL_CAPSULE | Freq: Two times a day (BID) | ORAL | Status: DC | PRN
Start: 2020-03-04 — End: 2020-03-04

## 2020-03-04 MED ORDER — IBUPROFEN 400 MG TABLET
400.0000 mg | ORAL_TABLET | Freq: Four times a day (QID) | ORAL | Status: DC | PRN
Start: 2020-03-04 — End: 2020-03-04

## 2020-03-04 MED ORDER — ACETAMINOPHEN 325 MG TABLET
650.0000 mg | ORAL_TABLET | Freq: Four times a day (QID) | ORAL | Status: DC | PRN
Start: 2020-03-04 — End: 2020-03-04

## 2020-03-04 NOTE — Group Note (Signed)
PT did not attend group.     Group topic:  RECREATION GROUP    Date of group:  03/04/2020  Start time of group:  1000  End time of group:  1100               Summary of group discussion: Pts participated in discussion about coping skills and using hobbies for stress relief. As an example, pts followed instruction to complete origami frogs.       Matthew Frye  is a 24 y.o. male participating in a recreation group.    Patient's mental status/affect:    Patient's behavior:    Patient's response:      Gaetano Net, Recreation Therapist  03/04/2020, 11:15

## 2020-03-04 NOTE — Behavioral Health (Signed)
Pt to be transferred to a different facility. No recreation therapy assessment completed due to transfer.   Gaetano Net, Recreation Therapist  03/04/2020, 15:58

## 2020-03-04 NOTE — Nurses Notes (Signed)
This RN spoke with Jinger Neighbors at [facility].  They are still trying to find placement for Anais.  Kalimah Capurro L. Tollie Pizza, RN  03/04/2020, 11:40

## 2020-03-04 NOTE — Nurses Notes (Signed)
Matthew Frye at [facility] notified that if patient is committed at his probable cause hearing Dr. Aquilla Hacker is requesting that further placement be found.  Makinzee Durley L. Tollie Pizza, RN  03/04/2020, 09:04

## 2020-03-04 NOTE — Care Plan (Signed)
Pt resting in bed with eyes closed.  Resp quiet and non-labored.  Provided calm and quiet environment conducive to sleep.  Pt appears to have slept well throughout shift will continue to montior per protocol.  Problem: Adult Behavioral Health Plan of Care  Goal: Patient-Specific Goal (Individualization)  Outcome: Ongoing (see interventions/notes)

## 2020-03-04 NOTE — Group Note (Signed)
Group topic:  EDUCATION GROUP    Date of group:  03/04/2020  Start time of group:  1300  End time of group:  1400      Summary of group:             Summary of group discussion:  Today's discussion covered and recovered unit rules, cleaning up after themselves, and self esteem.      Matthew Frye  is a 24 y.o. male participating in a education group.    Patient observations:  Pt prompted but did not attend.      Patient goals:      Matthew Frye, MHS  03/04/2020, 14:24

## 2020-03-04 NOTE — Nurses Notes (Signed)
Matthew Frye's mother, Matthew Frye, notified that [name] is being transferred to [facility] in [place],  [place]  Rishikesh Khachatryan L. Tollie Pizza, RN  03/04/2020, 15:35

## 2020-03-04 NOTE — Nurses Notes (Signed)
Nursing report called to [facility] at [facility] in [place], [place]  Matthew Frye L. Tollie Pizza, RN  03/04/2020, 15:54
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
## 2020-03-04 NOTE — Group Note (Signed)
Group topic:  EDUCATION GROUP    Date of group:  03/04/2020  Start time of group:  1500  End time of group:  1550      Summary of group:             Summary of group discussion:  Discussed mental health treatment such as medication, therapy, using positive coping skills, and doing daily ADLs.      ORDELL PRICHETT  is a 24 y.o. male who was informed of patient education group and chose not to attend stating "I'm not doing anything."    Patient observations:      Patient goals:      Maja Mccaffery L. Tollie Pizza, RN  03/04/2020, 16:00
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
## 2020-03-04 NOTE — Care Plan (Signed)
Matthew Frye reports that he slept "fine" last night. Reports that he feels "mad" today because he wants to go home and does not understand why he is in the hospital.  Matthew Frye focuses on wanting to go home.  Matthew Frye refused to get breakfast reporting that he is the son of God and does not need to eat or drink.  Has been observed talking to himself.  Chanting while sitting in the hallway.  Refuses to answer most questions but denies suicidal and homicidal ideation. Denies hallucinations. Slow to respond to questions. Has had no interaction with peers and minimal interaction with staff.  Affect is flat. Eye contact is poor.  Looks straight ahead during the conversation.  Refused to take scheduled Zyprexa stating "there is nothing wrong with me and I don't need any medicine."   [name], RN  03/04/2020, 12:56    Problem: Adult Behavioral Health Plan of Care  Goal: Patient-Specific Goal (Individualization)  Outcome: Ongoing (see interventions/notes)  Flowsheets (Taken 03/04/2020 1135)  Patient-Specific Goals (Include Timeframe): "to get out of here."

## 2020-03-04 NOTE — Nurses Notes (Signed)
Spoke with [facility] and placement for pt is [facility] [employer] conservator/restorer for [employer] office to notify  Rise Mu, Unit [place]

## 2020-03-04 NOTE — Nurses Notes (Signed)
Pt to have hearing 10.25.21  Records [facility]ed to jenn w at [facility]  Pt met with psychologist at [facility] via [place], Unit [place]

## 2020-03-04 NOTE — Behavioral Health (Signed)
Pt is resting in bed. He slept all night

## 2020-03-04 NOTE — Nurses Notes (Signed)
Verbal order given per  Maryelizabeth Kaufmann, [facility] [employer] Commissioner. Patient is committed to [facility] or like facility for a period of up to 30 days.    Jinger Neighbors at [facility] notified of commitment.  Lise Pincus L. Tollie Pizza, RN  03/04/2020, 09:53

## 2020-03-04 NOTE — Discharge Instructions (Signed)
Follow up appointments will be made upon discharge from [facility]

## 2020-03-04 NOTE — Nurses Notes (Signed)
Left voicemail  with jenn w at [facility] to see if there is placement yet.  Rise Mu, Unit [place]

## 2020-03-04 NOTE — Group Note (Signed)
Pt did not attend group.     Group topic:  RECREATION GROUP    Date of group:  03/04/2020  Start time of group:  1400  End time of group:  1500               Summary of group discussion: Pts participated in drawing, painting, coloring, and music listening to promote positive socialization and relaxation.       Matthew Frye  is a 24 y.o. male participating in a recreation group.    Patient's mental status/affect:    Patient's behavior:    Patient's response:      Gaetano Net, Recreation Therapist  03/04/2020, 15:38

## 2020-03-04 NOTE — Nurses Notes (Signed)
Received Probable cause Child psychotherapist. Made copy for transport packet.   Rise Mu, Unit [place]

## 2020-03-04 NOTE — Nurses Notes (Signed)
Matthew Frye denies suicidal and homicidal ideation. Continues to be observed talking to himself. Slow to respond to questions but denies hallucinations. Transferred to [facility] in [place], [place]. Ambulatory. Accompanied by 2 [facility] Deputies.  [name] L. Tollie Pizza, RN  03/04/2020, 16:25

## 2020-03-04 NOTE — Group Note (Signed)
Psychoeducational Group Note  Date of group:  03/04/2020  Start time of group:  1100  End time of group:  1140               Summary of group discussion: Pts completed a group for utilizing humor as a coping skills. Pts shared ways that humor can be used appropriately to defuse a situation. Pts shared qualities about their favorite comedians that they like including comedians making jokes about real life situations. Pts also discussed discharge plans including follow up and transportation home. Time was given for pts to ask questions or express concerns they had about the unit or discharge.       Rance Muir  s a 24 y.o. male participating in a psychoeducational group.        Comments: prompted but did not attend     Evelina Bucy, MHS 03/04/2020 12:23

## 2020-03-15 DIAGNOSIS — F209 Schizophrenia, unspecified: Secondary | ICD-10-CM | POA: Diagnosis not present

## 2020-03-18 DIAGNOSIS — F209 Schizophrenia, unspecified: Secondary | ICD-10-CM | POA: Diagnosis not present

## 2020-03-19 DIAGNOSIS — F3289 Other specified depressive episodes: Secondary | ICD-10-CM | POA: Diagnosis not present

## 2020-03-22 ENCOUNTER — Telehealth: Payer: Self-pay | Admitting: Primary Care

## 2020-03-22 NOTE — Telephone Encounter (Signed)
Richard Newton is your patient and her son would like to establish w/you.  Are you willing to take him on as a new patient?  Please advise.  Thank you!

## 2020-03-23 NOTE — Telephone Encounter (Signed)
Yes, I can take him on as a new patient, I have been speaking with his mother via My Chart.  Okay to add him on the schedule for whenever. No preference.

## 2020-03-25 NOTE — Telephone Encounter (Signed)
Viewed schedule and saw pt scheduled with Jae Dire.

## 2020-03-25 NOTE — Telephone Encounter (Signed)
Called to clarify if wanting to still schedule with Jae Dire or stick with new patient appointment with Dr.Cody. LVM to call back

## 2020-03-27 DIAGNOSIS — F3289 Other specified depressive episodes: Secondary | ICD-10-CM | POA: Diagnosis not present

## 2020-04-01 DIAGNOSIS — F209 Schizophrenia, unspecified: Secondary | ICD-10-CM | POA: Diagnosis not present

## 2020-04-08 DIAGNOSIS — F3289 Other specified depressive episodes: Secondary | ICD-10-CM | POA: Diagnosis not present

## 2020-04-09 DIAGNOSIS — F209 Schizophrenia, unspecified: Secondary | ICD-10-CM | POA: Diagnosis not present

## 2020-04-15 ENCOUNTER — Ambulatory Visit: Payer: Self-pay | Admitting: Primary Care

## 2020-04-15 ENCOUNTER — Encounter: Payer: Self-pay | Admitting: Primary Care

## 2020-04-15 ENCOUNTER — Other Ambulatory Visit: Payer: Self-pay

## 2020-04-15 VITALS — BP 138/88 | HR 103 | Temp 97.6°F | Ht 68.0 in | Wt 160.0 lb

## 2020-04-15 DIAGNOSIS — F209 Schizophrenia, unspecified: Secondary | ICD-10-CM | POA: Insufficient documentation

## 2020-04-15 DIAGNOSIS — R5383 Other fatigue: Secondary | ICD-10-CM

## 2020-04-15 DIAGNOSIS — G8929 Other chronic pain: Secondary | ICD-10-CM

## 2020-04-15 DIAGNOSIS — Z1159 Encounter for screening for other viral diseases: Secondary | ICD-10-CM | POA: Diagnosis not present

## 2020-04-15 DIAGNOSIS — M542 Cervicalgia: Secondary | ICD-10-CM

## 2020-04-15 DIAGNOSIS — Z114 Encounter for screening for human immunodeficiency virus [HIV]: Secondary | ICD-10-CM

## 2020-04-15 NOTE — Assessment & Plan Note (Addendum)
Recent diagnosis, however patient does not agree. He does exhibit symptoms of anxiety and depression, perhaps also ADHD.  Referral placed to local psychiatry. Continue Abilify 30 mg for now, this does seem like a very high dose.  Also, he seems to be experiencing common side effects of Abilify which are mentioned in HPI.  Discussed this with patient  He may need refills before he can see psychiatry.

## 2020-04-15 NOTE — Progress Notes (Signed)
Subjective:    Patient ID: Richard Newton, male    DOB: July 04, 1995, 24 y.o.   MRN: [id number]  HPI  This visit occurred during the SARS-CoV-2 public health emergency.  Safety protocols were in place, including screening questions prior to the visit, additional usage of staff PPE, and extensive cleaning of exam room while observing appropriate contact time as indicated for disinfecting solutions.   Richard Newton is a 24 year old male who presents today to establish care and discuss the problems mentioned below. Will obtain/review records.   1) Schizophrenia: Diagnosed in October 2021 during mental health hospitalization Richard Newton) in [place].  He was admitted due to concerns from family for symptoms of hearing voices while making music, agitation, speaking in tongues.  He was in the hospital for about 10 days, then released.  During his hospitalization, HIV lab testing returned positive and then negative with additional testing.   Currently managed on Abilify 30 mg for which he's been taking for about two weeks, this was switched from injection by his outpatient psychiatrist in [place].  Overall, he doesn't believe that he's noticed much improvement in any treatment. He believes he was misdiagnosed with schizophrenia, thinks he has ADHD.  Symptoms include hearing voices while making music, difficulty focusing/concentrating, fatigue, agitated when starting tasks, sleep disturbances. His difficulty focusing has been chronic for years, has always had a "wondering mind". Fatigue, sleep disturbances, decreased libido increased hunger began when he started taking Abilify.  He recently moved to [facility], does not have an outpatient psychiatrist.  2) Chronic Neck Pain: Chronic to the left posterior neck with radiation to left upper extremity. His pain is constant but worse when he "pop his collar bone", left lateral rotation of his head, moving left upper extremity across his body. This  began at the age [age]. He saw a chiropractor about one month ago, was told that there was "nothing" they can do. He completed an xray one month ago, was told that his cervical vertebra were straight without curvature which was likely contributing to symptoms  He underwent physical therapy about 6 six years ago, also evaluated by orthopedics and was told that there was nothing that could be done except for surgery.  He is frustrated by his ongoing pain and would like some relief.  He does not take anything over-the-counter.  He denies numbness/tingling, weakness to left extremity.  Review of Systems  Constitutional: Positive for fatigue.  Genitourinary:       Decreased libido  Musculoskeletal: Positive for arthralgias and neck pain.  Neurological: Negative for numbness.  Psychiatric/Behavioral: Positive for sleep disturbance. The patient is nervous/anxious.        See HPI       Past Medical History:  Diagnosis Date  . Asthma      Social History   Socioeconomic History  . Marital status: Single    Spouse name: Not on file  . Number of children: Not on file  . Years of education: Not on file  . Highest education level: Not on file  Occupational History  . Not on file  Tobacco Use  . Smoking status: Not on file  Vaping Use  . Vaping Use: Former  . Quit date: 04/08/2020  Substance and Sexual Activity  . Alcohol use: Not Currently  . Drug use: Never  . Sexual activity: Not on file  Other Topics Concern  . Not on file  Social History Narrative  . Not on file   Social  Determinants of Health   Financial Resource Strain:   . Difficulty of Paying Living Expenses: Not on file  Food Insecurity:   . Worried About Programme researcher, broadcasting/film/video in the Last Year: Not on file  . Ran Out of Food in the Last Year: Not on file  Transportation Needs:   . Lack of Transportation (Medical): Not on file  . Lack of Transportation (Non-Medical): Not on file  Physical Activity:   . Days of  Exercise per Week: Not on file  . Minutes of Exercise per Session: Not on file  Stress:   . Feeling of Stress : Not on file  Social Connections:   . Frequency of Communication with Friends and Family: Not on file  . Frequency of Social Gatherings with Friends and Family: Not on file  . Attends Religious Services: Not on file  . Active Member of Clubs or Organizations: Not on file  . Attends Banker Meetings: Not on file  . Marital Status: Not on file  Intimate Partner Violence:   . Fear of Current or Ex-Partner: Not on file  . Emotionally Abused: Not on file  . Physically Abused: Not on file  . Sexually Abused: Not on file    No family history on file.  No Known Allergies  Current Outpatient Medications on File Prior to Visit  Medication Sig Dispense Refill  . ARIPiprazole (ABILIFY) 30 MG tablet Take 30 mg by mouth daily.     No current facility-administered medications on file prior to visit.    BP 138/88   Pulse (!) 103   Temp 97.6 F (36.4 C) (Temporal)   Ht 5\' 8"  (1.727 m)   Wt 160 lb (72.6 kg)   SpO2 97%   BMI 24.33 kg/m    Objective:   Physical Exam Cardiovascular:     Rate and Rhythm: Normal rate and regular rhythm.  Pulmonary:     Effort: Pulmonary effort is normal.     Breath sounds: Normal breath sounds.  Musculoskeletal:     Cervical back: Normal range of motion and neck supple. No torticollis. Pain with movement present.  Skin:    General: Skin is warm and dry.  Psychiatric:        Mood and Affect: Mood normal.        Behavior: Behavior normal.        Thought Content: Thought content normal.            Assessment & Plan:

## 2020-04-15 NOTE — Patient Instructions (Signed)
Stop by the lab prior to leaving today. I will notify you of your results once received.   You will be contacted regarding your referral to psychiatry and also physical medicine for your neck pain.  Please let us know if you have not been contacted within two weeks.  It was a pleasure to meet you today! Please don't hesitate to call or message me with any questions. Welcome to [facility]!

## 2020-04-15 NOTE — Assessment & Plan Note (Signed)
Chronic since early childhood. Recently underwent plain films of the cervical spine, he will work on getting those records.  Referral placed to physical medicine for evaluation.

## 2020-04-16 LAB — COMPREHENSIVE METABOLIC PANEL
ALT: 32 U/L (ref 0–53)
AST: 24 U/L (ref 0–37)
Albumin: 4.7 g/dL (ref 3.5–5.2)
Alkaline Phosphatase: 54 U/L (ref 39–117)
BUN: 12 mg/dL (ref 6–23)
CO2: 32 mEq/L (ref 19–32)
Calcium: 10.3 mg/dL (ref 8.4–10.5)
Chloride: 100 mEq/L (ref 96–112)
Creatinine, Ser: 1.14 mg/dL (ref 0.40–1.50)
GFR: 90.2 mL/min (ref >60.00)
Glucose, Bld: 93 mg/dL (ref 70–99)
Potassium: 4.3 mEq/L (ref 3.5–5.1)
Sodium: 139 mEq/L (ref 135–145)
Total Bilirubin: 0.4 mg/dL (ref 0.2–1.2)
Total Protein: 7.4 g/dL (ref 6.0–8.3)

## 2020-04-16 LAB — CBC
HCT: 43.3 % (ref 39.0–52.0)
Hemoglobin: 14.9 g/dL (ref 13.0–17.0)
MCHC: 34.3 g/dL (ref 30.0–36.0)
MCV: 91.3 fl (ref 78.0–100.0)
Platelets: 211 10*3/uL (ref 150.0–400.0)
RBC: 4.74 Mil/uL (ref 4.22–5.81)
RDW: 13.2 % (ref 11.5–15.5)
WBC: 7.4 10*3/uL (ref 4.0–10.5)

## 2020-04-16 LAB — HEPATITIS C ANTIBODY
Hepatitis C Ab: NONREACTIVE
SIGNAL TO CUT-OFF: 0.02 (ref <1.00)

## 2020-04-16 LAB — HIV ANTIBODY (ROUTINE TESTING W REFLEX): HIV 1&2 Ab, 4th Generation: NONREACTIVE

## 2020-04-16 LAB — TSH: TSH: 0.74 u[IU]/mL (ref 0.35–4.50)

## 2020-04-16 LAB — T4, FREE: Free T4: 0.63 ng/dL (ref 0.60–1.60)

## 2020-04-17 DIAGNOSIS — F3289 Other specified depressive episodes: Secondary | ICD-10-CM | POA: Diagnosis not present

## 2020-04-19 ENCOUNTER — Telehealth: Payer: Self-pay

## 2020-04-19 NOTE — Telephone Encounter (Signed)
Called and spoke to mom. Reviewed all information as well as gave information on my chart message. They will pull up and take him for evaluation.

## 2020-04-19 NOTE — Telephone Encounter (Signed)
I sent the patient a my chart message on 04/16/20, it doesn't look like he's read this. If he's having problems with the medication the I recommend he reduce to 1/2 tablet daily for now, have him update Monday.  I really recommend that she take him to the outpatient behavorial health care center mentioned in his message dated 04/16/20 for his results. He can walk in and get immediate care and will get set up with a psychiatrist. Have them read my message.

## 2020-04-20 ENCOUNTER — Encounter (HOSPITAL_COMMUNITY): Payer: Self-pay | Admitting: Emergency Medicine

## 2020-04-20 ENCOUNTER — Other Ambulatory Visit: Payer: Self-pay

## 2020-04-20 ENCOUNTER — Ambulatory Visit (HOSPITAL_COMMUNITY)
Admission: EM | Admit: 2020-04-20 | Discharge: 2020-04-20 | Disposition: A | Discharge status: Home / Self Care | Payer: BC Managed Care – PPO

## 2020-04-20 DIAGNOSIS — F251 Schizoaffective disorder, depressive type: Secondary | ICD-10-CM | POA: Diagnosis not present

## 2020-04-20 DIAGNOSIS — F1994 Other psychoactive substance use, unspecified with psychoactive substance-induced mood disorder: Secondary | ICD-10-CM

## 2020-04-20 NOTE — Discharge Instructions (Signed)
Take all medications as prescribed. Keep all follow-up appointments as scheduled.  Do not consume alcohol or use illegal drugs while on prescription medications. Report any adverse effects from your medications to your primary care provider promptly.  In the event of recurrent symptoms or worsening symptoms, call 911, a crisis hotline, or go to the nearest emergency department for evaluation.   

## 2020-04-20 NOTE — ED Triage Notes (Addendum)
Pt presents to [facility] with his mother. Pt just moved from [place] recently and is not followed by a psychiatrist. Pt was prescribed a new medication, Abilify 30 mg, which has been interfering with his job duties, and "it's not working." Pt requested a medication change/adjustment and was referred by his PCP. PT denies SI/HI and AVH.

## 2020-04-20 NOTE — ED Provider Notes (Signed)
Behavioral Health Urgent Care Medical Screening Exam  Patient Name: Richard Newton MRN: [id number] Date of Evaluation: 04/20/20 Chief Complaint:   Diagnosis:  Final diagnoses:  Mood disorder, drug-induced (HCC)    History of Present illness: Richard Newton is a 24 y.o. male. Presents to behavioral health urgent care as a walk-in. Patient reports he is accompanied by his mother who is awaiting in the lobby.  States he was recently diagnosed with schizophrenia due to auditory hallucinations.  Patient reports hallucinations started after smoking marijuana.  States that he hears voices in another" language only when playing music" states the voices have subsided since being started on Abilify.  States he feels as if Abilify 30 mg is too high due to worsening anxiety and depression since taking this medication 3 weeks ago.  Reports he was advised to take  half a pill, which he did this morning.  Reports "feeling better today".  He is denying suicidal or homicidal ideations. Currently denying auditory or visual hallucinations.  Discussed overnight observation for medication adjustment and/or changing.  Patient declined Offered partial hospitalization programming.  Patient was receptive receptive to plan.  Support, encouragement and reassurance was provided.  Psychiatric Specialty Exam  Presentation  General Appearance:Appropriate for Environment  Eye Contact:Good  Speech:Clear and Coherent  Speech Volume:Normal  Handedness:Left   Mood and Affect  Mood:Anxious  Affect:Congruent   Thought Process  Thought Processes:Coherent  Descriptions of Associations:Intact  Orientation:Full (Time, Place and Person)  Thought Content:Rumination  Hallucinations:Auditory  Ideas of Reference:Paranoia  Suicidal Thoughts:No  Homicidal Thoughts:No   Sensorium  Memory:Immediate Good; Recent Good; Remote Good  Judgment:Good  Insight:Fair; Good   Executive Functions   Concentration:Fair  Attention Span:Good  Recall:Fair  Fund of Knowledge:Fair  Language:Fair   Psychomotor Activity  Psychomotor Activity:Normal   Assets  Assets:Communication Skills; Physical Health   Sleep  Sleep:Fair  Number of hours: No data recorded  Physical Exam: Physical Exam ROS Blood pressure 139/87, pulse 80, temperature 98.4 F (36.9 C), temperature source Oral, resp. rate 18, height 5\' 8"  (1.727 m), weight 160 lb (72.6 kg), SpO2 100 %. Body mass index is 24.33 kg/m.  Musculoskeletal: Strength & Muscle Tone: within normal limits Gait & Station: normal Patient leans: N/A   [facility] MSE Discharge Disposition for Follow up and Recommendations: Based on my evaluation the patient does not appear to have an emergency medical condition and can be discharged with resources and follow up care in outpatient services for Medication Management, Partial Hospitalization Program and Individual Therapy   -Patient was offered overnight observation for medication adjustments patient declined -Patient reports he is currently Abilify half dose.-" feeling better, with 1/2 with dose."  CSW to provide additional outpatient resources: -Recommend follow-up with therapist and/or psychiatrist -Consider follow-up with partial hospitalization programming     , NP 04/20/2020, 12:02 PM

## 2020-04-20 NOTE — ED Provider Notes (Signed)
This was opened in error see chart
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
## 2020-05-07 ENCOUNTER — Ambulatory Visit: Payer: Self-pay | Admitting: Family Medicine

## 2020-05-22 DIAGNOSIS — F251 Schizoaffective disorder, depressive type: Secondary | ICD-10-CM

## 2020-05-22 DIAGNOSIS — F209 Schizophrenia, unspecified: Secondary | ICD-10-CM

## 2020-05-29 MED ORDER — ARIPIPRAZOLE 15 MG PO TABS: 15.0000 mg | ORAL_TABLET | Freq: Every day | ORAL | 0 refills | Status: DC

## 2020-06-04 MED ORDER — ARIPIPRAZOLE 30 MG PO TABS: 15.0000 mg | ORAL_TABLET | Freq: Every day | ORAL | 0 refills | Status: AC

## 2020-06-20 NOTE — Progress Notes (Deleted)
Psychiatric Initial Adult Assessment   Patient Identification: Richard Newton MRN:  [id number] Date of Evaluation:  06/20/2020 Referral Source: *** Chief Complaint:   Visit Diagnosis: No diagnosis found.  History of Present Illness:   Richard Newton is a 25 y.o. year old male with a history of schizophrenia, chronic neck pain, who is referred for schizophrenia.          Associated Signs/Symptoms: Depression Symptoms:  {DEPRESSION SYMPTOMS:20000} (Hypo) Manic Symptoms:  {BHH MANIC SYMPTOMS:22872} Anxiety Symptoms:  {BHH ANXIETY SYMPTOMS:22873} Psychotic Symptoms:  {BHH PSYCHOTIC SYMPTOMS:22874} PTSD Symptoms: {BHH PTSD SYMPTOMS:22875}  Past Psychiatric History:  Outpatient:  Psychiatry admission:  Previous suicide attempt:  Past trials of medication:  History of violence:   Previous Psychotropic Medications: {YES/NO:21197}  Substance Abuse History in the last 12 months:  {yes no:314532}  Consequences of Substance Abuse: {BHH CONSEQUENCES OF SUBSTANCE ABUSE:22880}  Past Medical History:  Past Medical History:  Diagnosis Date  . Asthma   . Chronic neck pain   . Marijuana abuse   . Schizophrenia (HCC)    No past surgical history on file.  Family Psychiatric History: ***  Family History:  Family History  Problem Relation Age of Onset  . Thyroid disease Mother   . Anxiety disorder Mother   . Depression Mother     Social History:   Social History   Socioeconomic History  . Marital status: Single    Spouse name: Not on file  . Number of children: Not on file  . Years of education: Not on file  . Highest education level: Not on file  Occupational History  . Not on file  Tobacco Use  . Smoking status: Not on file  . Smokeless tobacco: Not on file  Vaping Use  . Vaping Use: Former  . Quit date: 04/08/2020  Substance and Sexual Activity  . Alcohol use: Not Currently  . Drug use: Never  . Sexual activity: Not on file  Other Topics Concern  . Not on file   Social History Narrative  . Not on file   Social Determinants of Health   Financial Resource Strain: Not on file  Food Insecurity: Not on file  Transportation Needs: Not on file  Physical Activity: Not on file  Stress: Not on file  Social Connections: Not on file    Additional Social History: ***  Allergies:  No Known Allergies  Metabolic Disorder Labs: No results found for: HGBA1C, MPG No results found for: PROLACTIN No results found for: CHOL, TRIG, HDL, CHOLHDL, VLDL, LDLCALC Lab Results  Component Value Date   TSH 0.74 04/15/2020    Therapeutic Level Labs: No results found for: LITHIUM No results found for: CBMZ No results found for: VALPROATE  Current Medications: Current Outpatient Medications  Medication Sig Dispense Refill  . ARIPiprazole (ABILIFY) 30 MG tablet Take 0.5 tablets (15 mg total) by mouth daily. 30 tablet 0   No current facility-administered medications for this visit.    Musculoskeletal: Strength & Muscle Tone: N/A Gait & Station: N/A Patient leans: N/A  Psychiatric Specialty Exam: Review of Systems  There were no vitals taken for this visit.There is no height or weight on file to calculate BMI.  General Appearance: {Appearance:22683}  Eye Contact:  {BHH EYE CONTACT:22684}  Speech:  Clear and Coherent  Volume:  Normal  Mood:  {BHH MOOD:22306}  Affect:  {Affect (PAA):22687}  Thought Process:  Coherent  Orientation:  Full (Time, Place, and Person)  Thought Content:  Logical  Suicidal Thoughts:  {ST/HT (  PAA):22692}  Homicidal Thoughts:  {ST/HT (PAA):22692}  Memory:  Immediate;   Good  Judgement:  {Judgement (PAA):22694}  Insight:  {Insight (PAA):22695}  Psychomotor Activity:  Normal  Concentration:  Concentration: Good and Attention Span: Good  Recall:  Good  Fund of Knowledge:Good  Language: Good  Akathisia:  No  Handed:  Right  AIMS (if indicated):  not done  Assets:  Communication Skills Desire for Improvement  ADL's:   Intact  Cognition: WNL  Sleep:  {BHH GOOD/FAIR/POOR:22877}   Screenings: GAD-7   Flowsheet Row Office Visit from 04/15/2020 in [facility]  Total GAD-7 Score 16    PHQ2-9   Flowsheet Row ED from 04/20/2020 in [facility] Office Visit from 04/15/2020 in [facility]  PHQ-2 Total Score 4 6  PHQ-9 Total Score 16 21    Flowsheet Row ED from 04/20/2020 in [facility]  C-SSRS RISK CATEGORY Low Risk      Assessment and Plan:  Assessment  Plan  The patient demonstrates the following risk factors for suicide: Chronic risk factors for suicide include: {Chronic Risk Factors for ZOXWRUE:45409811}. Acute risk factors for suicide include: {Acute Risk Factors for BJYNWGN:56213086}. Protective factors for this patient include: {Protective Factors for Suicide VHQI:69629528}. Considering these factors, the overall suicide risk at this point appears to be {Desc; low/moderate/high:110033}. Patient {ACTION; IS/IS UXL:24401027} appropriate for outpatient follow up.     Richard Hotter, MD 2/10/20224:41 PM

## 2020-06-24 ENCOUNTER — Telehealth: Payer: Self-pay | Admitting: Psychiatry

## 2020-06-24 ENCOUNTER — Other Ambulatory Visit: Payer: Self-pay

## 2020-06-24 ENCOUNTER — Telehealth: Payer: BC Managed Care – PPO | Admitting: Psychiatry

## 2020-06-24 NOTE — Telephone Encounter (Signed)
Sent link for video visit through Epic to both home phone and mobile. Patient did not sign in. Called the patient for appointment scheduled today. Mobile number was not in active service. Contacted the home number as well- The patient did not answer the phone. Left voice message to contact the office.
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
## 2020-07-01 ENCOUNTER — Encounter: Payer: Self-pay | Admitting: Psychiatry

## 2020-07-01 ENCOUNTER — Telehealth (INDEPENDENT_AMBULATORY_CARE_PROVIDER_SITE_OTHER): Payer: Self-pay | Admitting: Psychiatry

## 2020-07-01 ENCOUNTER — Telehealth: Payer: Self-pay | Admitting: Psychiatry

## 2020-07-01 ENCOUNTER — Other Ambulatory Visit: Payer: Self-pay

## 2020-07-01 DIAGNOSIS — R41840 Attention and concentration deficit: Secondary | ICD-10-CM

## 2020-07-01 DIAGNOSIS — F209 Schizophrenia, unspecified: Secondary | ICD-10-CM

## 2020-07-01 MED ORDER — ARIPIPRAZOLE 20 MG PO TABS: 20.0000 mg | ORAL_TABLET | Freq: Every day | ORAL | 0 refills | Status: DC
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
## 2020-07-29 ENCOUNTER — Telehealth (INDEPENDENT_AMBULATORY_CARE_PROVIDER_SITE_OTHER): Payer: Self-pay | Admitting: Psychiatry

## 2020-07-29 ENCOUNTER — Encounter: Payer: Self-pay | Admitting: Psychiatry

## 2020-07-29 ENCOUNTER — Other Ambulatory Visit: Payer: Self-pay

## 2020-07-29 DIAGNOSIS — F209 Schizophrenia, unspecified: Secondary | ICD-10-CM

## 2020-07-29 MED ORDER — ARIPIPRAZOLE 20 MG PO TABS
20.0000 mg | ORAL_TABLET | Freq: Every day | ORAL | 1 refills | Status: AC
Start: 2020-07-29 — End: 2020-09-27

## 2020-07-29 NOTE — Patient Instructions (Signed)
1. Continue Abilify 20 mg at night 2. Next appointment: 4/25 at 11:30

## 2020-08-05 DIAGNOSIS — F3289 Other specified depressive episodes: Secondary | ICD-10-CM | POA: Diagnosis not present
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
## 2020-09-02 ENCOUNTER — Other Ambulatory Visit: Payer: Self-pay

## 2020-09-02 ENCOUNTER — Ambulatory Visit: Payer: Self-pay | Admitting: Psychiatry

## 2020-11-08 ENCOUNTER — Other Ambulatory Visit: Payer: Self-pay

## 2020-11-08 ENCOUNTER — Ambulatory Visit (INDEPENDENT_AMBULATORY_CARE_PROVIDER_SITE_OTHER)
Admission: RE | Admit: 2020-11-08 | Discharge: 2020-11-08 | Disposition: A | Discharge status: Home / Self Care | Payer: BC Managed Care – PPO | Source: Ambulatory Visit | Attending: Primary Care | Admitting: Primary Care

## 2020-11-08 ENCOUNTER — Ambulatory Visit: Payer: BC Managed Care – PPO | Admitting: Primary Care

## 2020-11-08 VITALS — BP 112/72 | HR 72 | Temp 98.0°F | Resp 16 | Ht 68.0 in | Wt 158.0 lb

## 2020-11-08 DIAGNOSIS — G8929 Other chronic pain: Secondary | ICD-10-CM | POA: Diagnosis not present

## 2020-11-08 DIAGNOSIS — M542 Cervicalgia: Secondary | ICD-10-CM

## 2020-11-08 DIAGNOSIS — M25512 Pain in left shoulder: Secondary | ICD-10-CM | POA: Diagnosis not present

## 2020-11-08 NOTE — Assessment & Plan Note (Signed)
Unclear cause, he does have normal ROM, no recent trauma. Good strength.   Referral placed to orthopedics.  Checking plain films today.

## 2020-11-08 NOTE — Patient Instructions (Signed)
Complete xray(s) prior to leaving today. I will notify you of your results once received.  You will be contacted regarding your referral to the orthopedic doctor.  Please let us know if you have not been contacted within two weeks.   It was a pleasure to see you today!
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
## 2020-11-14 ENCOUNTER — Ambulatory Visit: Payer: BC Managed Care – PPO | Admitting: Orthopaedic Surgery

## 2020-11-14 ENCOUNTER — Ambulatory Visit: Payer: Self-pay

## 2020-11-14 ENCOUNTER — Other Ambulatory Visit: Payer: Self-pay

## 2020-11-14 ENCOUNTER — Encounter: Payer: Self-pay | Admitting: Orthopaedic Surgery

## 2020-11-14 DIAGNOSIS — M542 Cervicalgia: Secondary | ICD-10-CM | POA: Diagnosis not present

## 2020-11-14 DIAGNOSIS — G8929 Other chronic pain: Secondary | ICD-10-CM
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
## 2020-11-23 ENCOUNTER — Other Ambulatory Visit: Payer: BC Managed Care – PPO

## 2021-02-06 NOTE — Telephone Encounter (Signed)
CLOSED

## 2021-07-14 ENCOUNTER — Telehealth (INDEPENDENT_AMBULATORY_CARE_PROVIDER_SITE_OTHER): Payer: Self-pay | Admitting: Family Medicine

## 2021-07-15 ENCOUNTER — Encounter (INDEPENDENT_AMBULATORY_CARE_PROVIDER_SITE_OTHER): Payer: Self-pay | Admitting: Family Medicine

## 2021-07-15 ENCOUNTER — Other Ambulatory Visit: Payer: MEDICAID | Attending: Family Medicine | Admitting: Family Medicine

## 2021-07-15 ENCOUNTER — Ambulatory Visit (INDEPENDENT_AMBULATORY_CARE_PROVIDER_SITE_OTHER): Payer: MEDICAID | Admitting: Family Medicine

## 2021-07-15 ENCOUNTER — Other Ambulatory Visit (INDEPENDENT_AMBULATORY_CARE_PROVIDER_SITE_OTHER): Payer: MEDICAID

## 2021-07-15 ENCOUNTER — Other Ambulatory Visit: Payer: Self-pay

## 2021-07-15 ENCOUNTER — Ambulatory Visit (INDEPENDENT_AMBULATORY_CARE_PROVIDER_SITE_OTHER): Payer: MEDICAID

## 2021-07-15 VITALS — BP 106/62 | HR 87 | Temp 97.4°F | Resp 12 | Ht 69.0 in | Wt 165.4 lb

## 2021-07-15 DIAGNOSIS — M542 Cervicalgia: Secondary | ICD-10-CM

## 2021-07-15 DIAGNOSIS — Z0000 Encounter for general adult medical examination without abnormal findings: Secondary | ICD-10-CM

## 2021-07-15 DIAGNOSIS — G8929 Other chronic pain: Secondary | ICD-10-CM

## 2021-07-15 DIAGNOSIS — M25512 Pain in left shoulder: Secondary | ICD-10-CM

## 2021-07-15 DIAGNOSIS — F259 Schizoaffective disorder, unspecified: Secondary | ICD-10-CM

## 2021-07-15 DIAGNOSIS — F17200 Nicotine dependence, unspecified, uncomplicated: Secondary | ICD-10-CM

## 2021-07-15 LAB — URINALYSIS, MICROSCOPIC

## 2021-07-15 LAB — URINALYSIS, MACROSCOPIC
BILIRUBIN: NEGATIVE mg/dL
BLOOD: NEGATIVE mg/dL
GLUCOSE: NEGATIVE mg/dL
KETONES: NEGATIVE mg/dL
LEUKOCYTES: NEGATIVE WBCs/uL
NITRITE: NEGATIVE
PH: 6.5 (ref 5.0–8.0)
PROTEIN: NEGATIVE mg/dL
SPECIFIC GRAVITY: 1.009 (ref 1.002–1.030)
UROBILINOGEN: <2 mg/dL (ref <2.0)

## 2021-07-15 LAB — COMPREHENSIVE METABOLIC PNL, FASTING
ALBUMIN: 4.2 g/dL (ref 3.5–5.0)
ALKALINE PHOSPHATASE: 59 U/L (ref 45–115)
ALT (SGPT): 70 U/L — ABNORMAL HIGH (ref 10–55)
ANION GAP: 10 mmol/L (ref 4–13)
AST (SGOT): 32 U/L (ref 8–45)
BILIRUBIN TOTAL: 0.4 mg/dL (ref 0.3–1.3)
BUN/CREA RATIO: 11 (ref 6–22)
BUN: 9 mg/dL (ref 8–25)
CALCIUM: 10.1 mg/dL — ABNORMAL HIGH (ref 8.5–10.0)
CHLORIDE: 102 mmol/L (ref 96–111)
CO2 TOTAL: 27 mmol/L (ref 22–30)
CREATININE: 0.82 mg/dL (ref 0.75–1.35)
ESTIMATED GFR: >90 mL/min/BSA (ref >=60)
GLUCOSE: 80 mg/dL (ref 70–99)
POTASSIUM: 4.3 mmol/L (ref 3.5–5.1)
PROTEIN TOTAL: 7.2 g/dL (ref 6.4–8.3)
SODIUM: 139 mmol/L (ref 136–145)

## 2021-07-15 LAB — CBC WITH DIFF
BASOPHIL #: 0 10*3/uL (ref 0.00–0.30)
BASOPHIL %: 1 % (ref 0–1)
EOSINOPHIL #: 0.4 10*3/uL (ref 0.00–0.50)
EOSINOPHIL %: 6 % — ABNORMAL HIGH (ref 0–4)
HCT: 44 % (ref 42.0–52.0)
HGB: 14.8 g/dL (ref 13.5–18.0)
LYMPHOCYTE #: 1.7 10*3/uL (ref 0.90–4.80)
LYMPHOCYTE %: 26 % (ref 23–35)
MCH: 31 pg (ref 28.0–33.0)
MCHC: 33.7 g/dL (ref 32.0–37.0)
MCV: 91.9 fL (ref 78.0–100.0)
MONOCYTE #: 0.6 10*3/uL (ref 0.30–0.90)
MONOCYTE %: 9 % (ref 0–12)
NEUTROPHIL #: 3.7 10*3/uL (ref 1.70–7.00)
NEUTROPHIL %: 59 % (ref 50–70)
PLATELETS: 223 10*3/uL (ref 130–400)
RBC: 4.79 10*6/uL (ref 4.70–5.40)
RDW: 14.1 % (ref 9.9–16.5)
WBC: 6.3 10*3/uL (ref 4.5–11.0)

## 2021-07-15 LAB — LIPID PANEL
CHOL/HDL RATIO: 2.9
CHOLESTEROL: 265 mg/dL — ABNORMAL HIGH (ref 100–200)
HDL CHOL: 90 mg/dL (ref >=50)
LDL CALC: 159 mg/dL — ABNORMAL HIGH (ref <100)
NON-HDL: 175 mg/dL (ref <=190)
TRIGLYCERIDES: 94 mg/dL (ref <150)
VLDL CALC: 18 mg/dL (ref <30)

## 2021-07-15 LAB — THYROID STIMULATING HORMONE (SENSITIVE TSH): TSH: 0.562 u[IU]/mL (ref 0.430–3.550)

## 2021-07-15 LAB — THYROXINE, FREE (FREE T4): THYROXINE (T4), FREE: 0.78 ng/dL (ref 0.70–1.25)

## 2021-07-15 LAB — HEPATITIS C ANTIBODY SCREEN WITH REFLEX TO HCV PCR: HCV ANTIBODY QUALITATIVE: NEGATIVE
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
## 2021-11-10 ENCOUNTER — Other Ambulatory Visit: Payer: Self-pay | Admitting: Orthopaedic Surgery

## 2021-11-10 DIAGNOSIS — M542 Cervicalgia: Secondary | ICD-10-CM

## 2022-01-13 ENCOUNTER — Encounter (INDEPENDENT_AMBULATORY_CARE_PROVIDER_SITE_OTHER): Payer: Self-pay | Admitting: Family Medicine

## 2022-01-13 ENCOUNTER — Other Ambulatory Visit: Payer: Self-pay

## 2022-01-13 ENCOUNTER — Ambulatory Visit (INDEPENDENT_AMBULATORY_CARE_PROVIDER_SITE_OTHER): Payer: MEDICAID | Admitting: Family Medicine

## 2022-01-13 VITALS — BP 116/70 | HR 81 | Temp 97.2°F | Resp 12 | Ht 69.0 in | Wt 179.0 lb

## 2022-01-13 DIAGNOSIS — F320 Major depressive disorder, single episode, mild: Secondary | ICD-10-CM

## 2022-01-13 DIAGNOSIS — F1290 Cannabis use, unspecified, uncomplicated: Secondary | ICD-10-CM

## 2022-01-13 DIAGNOSIS — Z720 Tobacco use: Secondary | ICD-10-CM

## 2022-01-13 DIAGNOSIS — F259 Schizoaffective disorder, unspecified: Secondary | ICD-10-CM

## 2022-01-13 DIAGNOSIS — F988 Other specified behavioral and emotional disorders with onset usually occurring in childhood and adolescence: Secondary | ICD-10-CM

## 2022-01-13 DIAGNOSIS — S161XXS Strain of muscle, fascia and tendon at neck level, sequela: Secondary | ICD-10-CM
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
## 2022-07-09 ENCOUNTER — Other Ambulatory Visit (INDEPENDENT_AMBULATORY_CARE_PROVIDER_SITE_OTHER): Payer: 59

## 2022-07-09 ENCOUNTER — Encounter (INDEPENDENT_AMBULATORY_CARE_PROVIDER_SITE_OTHER): Payer: Self-pay | Admitting: Family Medicine

## 2022-07-09 ENCOUNTER — Other Ambulatory Visit: Payer: Self-pay

## 2022-07-09 ENCOUNTER — Other Ambulatory Visit: Payer: 59 | Attending: Family Medicine | Admitting: Family Medicine

## 2022-07-09 ENCOUNTER — Ambulatory Visit (INDEPENDENT_AMBULATORY_CARE_PROVIDER_SITE_OTHER): Payer: 59 | Admitting: Family Medicine

## 2022-07-09 ENCOUNTER — Ambulatory Visit (INDEPENDENT_AMBULATORY_CARE_PROVIDER_SITE_OTHER): Payer: 59

## 2022-07-09 VITALS — BP 116/70 | HR 67 | Temp 97.7°F | Resp 12 | Ht 69.0 in | Wt 165.8 lb

## 2022-07-09 DIAGNOSIS — M542 Cervicalgia: Secondary | ICD-10-CM

## 2022-07-09 DIAGNOSIS — Z0000 Encounter for general adult medical examination without abnormal findings: Secondary | ICD-10-CM

## 2022-07-09 DIAGNOSIS — Z0001 Encounter for general adult medical examination with abnormal findings: Secondary | ICD-10-CM

## 2022-07-09 DIAGNOSIS — F17200 Nicotine dependence, unspecified, uncomplicated: Secondary | ICD-10-CM

## 2022-07-09 LAB — URINALYSIS, MICROSCOPIC

## 2022-07-09 LAB — COMPREHENSIVE METABOLIC PNL, FASTING
ALBUMIN: 4.3 g/dL (ref 3.5–5.0)
ALKALINE PHOSPHATASE: 80 U/L (ref 45–115)
ALT (SGPT): 18 U/L (ref 10–55)
ANION GAP: 8 mmol/L (ref 4–13)
AST (SGOT): 22 U/L (ref 8–45)
BILIRUBIN TOTAL: 0.5 mg/dL (ref 0.3–1.3)
BUN/CREA RATIO: 8 (ref 6–22)
BUN: 10 mg/dL (ref 8–25)
CALCIUM: 9.9 mg/dL (ref 8.6–10.2)
CHLORIDE: 104 mmol/L (ref 96–111)
CO2 TOTAL: 27 mmol/L (ref 22–30)
CREATININE: 1.19 mg/dL (ref 0.75–1.35)
ESTIMATED GFR - MALE: 86 mL/min/BSA (ref >=60)
GLUCOSE: 84 mg/dL (ref 70–99)
POTASSIUM: 4.1 mmol/L (ref 3.5–5.1)
PROTEIN TOTAL: 7.8 g/dL (ref 6.4–8.3)
SODIUM: 139 mmol/L (ref 136–145)

## 2022-07-09 LAB — LIPID PANEL
CHOL/HDL RATIO: 2.7
CHOLESTEROL: 206 mg/dL — ABNORMAL HIGH (ref 100–200)
HDL CHOL: 77 mg/dL (ref >=50)
LDL CALC: 116 mg/dL — ABNORMAL HIGH (ref <100)
NON-HDL: 129 mg/dL (ref <=190)
TRIGLYCERIDES: 72 mg/dL (ref <150)
VLDL CALC: 12 mg/dL (ref <30)

## 2022-07-09 LAB — CBC WITH DIFF
BASOPHIL #: 0 10*3/uL (ref 0.00–0.30)
BASOPHIL %: 1 % (ref 0–1)
EOSINOPHIL #: 0.3 10*3/uL (ref 0.00–0.50)
EOSINOPHIL %: 5 % — ABNORMAL HIGH (ref 0–4)
HCT: 44.5 % (ref 42.0–52.0)
HGB: 15.4 g/dL (ref 13.5–18.0)
LYMPHOCYTE #: 1.7 10*3/uL (ref 0.90–4.80)
LYMPHOCYTE %: 27 % (ref 23–35)
MCH: 30.6 pg (ref 28.0–33.0)
MCHC: 34.6 g/dL (ref 32.0–37.0)
MCV: 88.4 fL (ref 78.0–100.0)
MONOCYTE #: 0.5 10*3/uL (ref 0.30–0.90)
MONOCYTE %: 8 % (ref 0–12)
NEUTROPHIL #: 3.8 10*3/uL (ref 1.70–7.00)
NEUTROPHIL %: 60 % (ref 50–70)
PLATELETS: 235 10*3/uL (ref 130–400)
RBC: 5.03 10*6/uL (ref 4.70–5.40)
RDW: 13.2 % (ref 9.9–16.5)
WBC: 6.3 10*3/uL (ref 4.5–11.0)

## 2022-07-09 LAB — URINALYSIS, MACROSCOPIC
BILIRUBIN: NEGATIVE mg/dL
BLOOD: NEGATIVE mg/dL
GLUCOSE: NEGATIVE mg/dL
KETONES: NEGATIVE mg/dL
LEUKOCYTES: NEGATIVE WBCs/uL
NITRITE: NEGATIVE
PH: 6 (ref 5.0–8.0)
PROTEIN: 20 mg/dL — AB
SPECIFIC GRAVITY: 1.032 — ABNORMAL HIGH (ref 1.002–1.030)
UROBILINOGEN: 2 mg/dL (ref <2.0)

## 2022-07-09 LAB — SCAN DIFFERENTIAL
RBC MORPHOLOGY COMMENT: NORMAL
WBC MORPHOLOGY COMMENT: NORMAL

## 2022-07-09 LAB — THYROXINE, FREE (FREE T4): THYROXINE (T4), FREE: 0.93 ng/dL (ref 0.70–1.48)

## 2022-07-09 LAB — THYROID STIMULATING HORMONE (SENSITIVE TSH): TSH: 0.62 u[IU]/mL (ref 0.350–4.940)

## 2022-07-09 MED ORDER — MELOXICAM 15 MG TABLET
15.0000 mg | ORAL_TABLET | Freq: Every day | ORAL | 3 refills | Status: AC
Start: 2022-07-09 — End: ?
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
## 2022-07-10 ENCOUNTER — Telehealth (INDEPENDENT_AMBULATORY_CARE_PROVIDER_SITE_OTHER): Payer: Self-pay | Admitting: Family Medicine

## 2022-07-10 NOTE — Addendum Note (Signed)
Addended by: Hendricks Limes on: 07/10/2022 11:05 AM     Modules accepted: Level of Service

## 2022-08-12 ENCOUNTER — Encounter (INDEPENDENT_AMBULATORY_CARE_PROVIDER_SITE_OTHER): Payer: Self-pay | Admitting: Family Medicine

## 2022-08-12 ENCOUNTER — Other Ambulatory Visit: Payer: Self-pay

## 2022-08-12 ENCOUNTER — Ambulatory Visit: Payer: 59 | Attending: Family Medicine | Admitting: Family Medicine

## 2022-08-12 VITALS — BP 117/80 | HR 58 | Temp 97.9°F | Ht 69.0 in | Wt 166.6 lb

## 2022-08-12 DIAGNOSIS — M542 Cervicalgia: Secondary | ICD-10-CM | POA: Insufficient documentation

## 2022-08-12 NOTE — Progress Notes (Signed)
FAMILY MEDICINE, [facility]  [place] [address]  Operated by [facility]     Name: Matthew Frye  MRN:  [id number]       Date: 08/12/2022  DOB:    19-Oct-1995  Age: 27 y.o.         Reason for Visit: Follow Up    History of Present Illness  Matthew Frye is a 27 y.o. male who is being seen today for neck pain follow-up.  Going to physical therapy.  Helping a little bit but the pain and inflammation irritation is still in the neck area mostly left side.  Medication did not really help that much he did notice a difference.       Past Medical History:   Diagnosis Date    Schizophrenia (CMS HCC)                Current Outpatient Medications   Medication Sig    meloxicam (MOBIC) 15 mg Oral Tablet Take 1 Tablet (15 mg total) by mouth Once a day    paliperidone palmitate (INVEGA SUSTENNA IM) Inject into the muscle (Patient not taking: Reported on 07/09/2022)     No Known Allergies  Family Medical History:    None         Social History     Tobacco Use    Smoking status: Some Days    Smokeless tobacco: Never    Tobacco comments:     Uses Nicotine gum   Vaping Use    Vaping status: Some Days   Substance Use Topics    Alcohol use: Yes     Comment: rare    Drug use: Yes     Types: Marijuana       Nursing note  There are no exam notes on file for this visit.     Review of Systems  Review of Systems   Constitutional: Negative.    HENT: Negative.     Eyes: Negative.    Respiratory: Negative.     Cardiovascular: Negative.    Gastrointestinal: Negative.    Genitourinary: Negative.    Musculoskeletal:  Positive for back pain, myalgias and neck pain.   Skin: Negative.    Neurological: Negative.    Endo/Heme/Allergies: Negative.    Psychiatric/Behavioral: Negative.         Physical Exam:  BP 117/80 (Site: Right, Patient Position: Sitting, Cuff Size: Adult)   Pulse 58   Temp 36.6 C (97.9 F) (Oral)   Ht 1.753 m (5\' 9" )   Wt 75.6 kg (166 lb 9.6 oz)   SpO2 98%   BMI 24.60  kg/m       Physical Exam  HENT:      Head: Normocephalic.   Eyes:      Conjunctiva/sclera: Conjunctivae normal.      Pupils: Pupils are equal, round, and reactive to light.   Neck:      Comments: C2 through C5 fairly good range of motion in flexion extension rotation side bending  Cardiovascular:      Rate and Rhythm: Normal rate and regular rhythm.      Heart sounds: Normal heart sounds.   Pulmonary:      Effort: Pulmonary effort is normal.      Breath sounds: Normal breath sounds.   Abdominal:      General: Bowel sounds are normal.      Palpations: Abdomen is soft.   Musculoskeletal:  General: Normal range of motion.      Cervical back: Normal range of motion. Tenderness present.   Skin:     General: Skin is warm.   Neurological:      Mental Status: He is alert and oriented to person, place, and time.      Gait: Gait is intact.   Psychiatric:         Mood and Affect: Mood and affect normal.         Cognition and Memory: Memory normal.         Judgment: Judgment normal.         Assessment and Plan    ENCOUNTER DIAGNOSES     ICD-10-CM   1. Cervical pain (neck)  M54.2        No orders of the defined types were placed in this encounter.     Try to get an MRI scheduled he has been doing physical therapy has failed anti-inflammatories and conservative treatment. On the day of the encounter, a total of  30 minutes was spent on this patient encounter including review of historical information, examination, documentation and post-visit activities. The time documented excludes procedural time.   Follow up: Return in about 6 years (around 08/11/2028).    Londell Moh, DO  08/12/2022, 08:39

## 2022-08-13 NOTE — Addendum Note (Signed)
Addended by: Theda Sers on: 08/13/2022 11:25 AM     Modules accepted: Level of Service

## 2022-08-13 NOTE — Addendum Note (Signed)
Addended by: Bradlee Bridgers L on: 08/13/2022 11:25 AM     Modules accepted: Level of Service

## 2022-08-17 ENCOUNTER — Telehealth (INDEPENDENT_AMBULATORY_CARE_PROVIDER_SITE_OTHER): Payer: Self-pay | Admitting: Family Medicine

## 2022-08-17 NOTE — Telephone Encounter (Signed)
08/17/22 pc spoke with Jackalyn Lombard gave appt date and time for MRI SPINE CERVICAL WO CONTRAST sat 08/22/22 at 4:45 pm arrive at 4:15 pm [facility]. Fax to [facility] scheduling MRI screening form dhilderbrand

## 2022-08-22 ENCOUNTER — Other Ambulatory Visit: Payer: Self-pay

## 2022-08-22 ENCOUNTER — Inpatient Hospital Stay
Admission: RE | Admit: 2022-08-22 | Discharge: 2022-08-22 | Disposition: A | Discharge status: Home / Self Care | Payer: 59 | Source: Ambulatory Visit | Attending: Family Medicine | Admitting: Family Medicine

## 2022-08-22 DIAGNOSIS — M542 Cervicalgia: Secondary | ICD-10-CM | POA: Insufficient documentation

## 2022-08-31 ENCOUNTER — Encounter (INDEPENDENT_AMBULATORY_CARE_PROVIDER_SITE_OTHER): Payer: Self-pay | Admitting: Family Medicine

## 2022-09-15 ENCOUNTER — Encounter (INDEPENDENT_AMBULATORY_CARE_PROVIDER_SITE_OTHER): Payer: Self-pay | Admitting: Family Medicine

## 2022-09-19 ENCOUNTER — Emergency Department
Admission: EM | Admit: 2022-09-19 | Discharge: 2022-09-19 | Disposition: A | Discharge status: Home / Self Care | Payer: 59 | Attending: Family | Admitting: Family

## 2022-09-19 ENCOUNTER — Other Ambulatory Visit: Payer: Self-pay

## 2022-09-19 ENCOUNTER — Emergency Department (HOSPITAL_COMMUNITY): Payer: 59

## 2022-09-19 DIAGNOSIS — R109 Unspecified abdominal pain: Secondary | ICD-10-CM | POA: Insufficient documentation

## 2022-09-19 DIAGNOSIS — I880 Nonspecific mesenteric lymphadenitis: Secondary | ICD-10-CM | POA: Insufficient documentation

## 2022-09-19 LAB — COMPREHENSIVE METABOLIC PANEL, NON-FASTING
ALBUMIN/GLOBULIN RATIO: 1.7 (ref 1.5–2.5)
ALBUMIN: 5 g/dL (ref 3.5–5.0)
ALKALINE PHOSPHATASE: 66 U/L (ref 38–126)
ALT (SGPT): 24 U/L (ref <50)
ANION GAP: 10 mmol/L (ref 5–19)
AST (SGOT): 34 U/L (ref 17–59)
BILIRUBIN TOTAL: 1 mg/dL (ref 0.2–1.3)
BUN/CREA RATIO: 10 (ref 6–20)
BUN: 11 mg/dL (ref 9–20)
CALCIUM: 10.4 mg/dL — ABNORMAL HIGH (ref 8.4–10.2)
CHLORIDE: 102 mmol/L (ref 98–107)
CO2 TOTAL: 28 mmol/L (ref 22–30)
CREATININE: 1.09 mg/dL (ref 0.66–1.20)
ESTIMATED GFR: >60 mL/min/{1.73_m2} (ref >60)
GLUCOSE: 94 mg/dL (ref 74–106)
POTASSIUM: 4.1 mmol/L (ref 3.5–5.1)
PROTEIN TOTAL: 8 g/dL (ref 6.3–8.2)
SODIUM: 140 mmol/L (ref 137–145)

## 2022-09-19 LAB — URINALYSIS, MACRO/MICRO
BILIRUBIN: NEGATIVE mg/dL
BLOOD: NEGATIVE mg/dL
GLUCOSE: NEGATIVE mg/dL
KETONES: 20 mg/dL — AB
LEUKOCYTES: NEGATIVE WBCs/uL
NITRITE: NEGATIVE
PH: 6.5 (ref 5.0–9.0)
PROTEIN: NEGATIVE mg/dL
SPECIFIC GRAVITY: 1.023 (ref 1.003–1.035)
UROBILINOGEN: 2 mg/dL — AB (ref <2.0)

## 2022-09-19 LAB — CBC WITH DIFF
BASOPHIL #: 0 10*3/uL (ref 0.00–0.20)
BASOPHIL %: 0 % (ref 0–2)
EOSINOPHIL #: 0.1 10*3/uL (ref 0.00–0.60)
EOSINOPHIL %: 1 % (ref 0–5)
HCT: 45.6 % (ref 36.0–46.0)
HGB: 15.8 g/dL (ref 13.9–16.3)
LYMPHOCYTE #: 1.4 10*3/uL (ref 1.10–3.80)
LYMPHOCYTE %: 15 % — ABNORMAL LOW (ref 19–46)
MCH: 31 pg (ref 25.4–34.0)
MCHC: 34.7 g/dL (ref 30.0–37.0)
MCV: 89.2 fL (ref 80.0–100.0)
MONOCYTE #: 0.6 10*3/uL (ref 0.10–0.80)
MONOCYTE %: 6 % (ref 4–12)
MPV: 9 fL (ref 7.5–11.5)
NEUTROPHIL #: 7.7 10*3/uL — ABNORMAL HIGH (ref 1.80–7.50)
NEUTROPHIL %: 78 % — ABNORMAL HIGH (ref 41–69)
PLATELETS: 255 10*3/uL (ref 130–400)
RBC: 5.11 10*6/uL (ref 4.30–5.90)
RDW: 13.5 % (ref 11.5–14.0)
WBC: 9.8 10*3/uL (ref 4.5–11.5)

## 2022-09-19 LAB — LIPASE: LIPASE: 40 U/L (ref 23–300)

## 2022-09-19 LAB — MICRO HOLD

## 2022-09-19 LAB — MAGNESIUM: MAGNESIUM: 1.9 mg/dL (ref 1.6–2.3)

## 2022-09-19 MED ORDER — IOPAMIDOL 300 MG IODINE/ML (61 %) INTRAVENOUS SOLUTION
75.0000 mL | INTRAVENOUS | Status: AC
Start: 2022-09-19 — End: 2022-09-19
  Administered 2022-09-19: 75 mL via INTRAVENOUS

## 2022-09-19 MED ORDER — KETOROLAC 30 MG/ML (1 ML) INJECTION SOLUTION
15.0000 mg | INTRAMUSCULAR | Status: AC
Start: 2022-09-19 — End: 2022-09-19
  Administered 2022-09-19: 15 mg via INTRAVENOUS
  Filled 2022-09-19: qty 1

## 2022-09-19 MED ORDER — ONDANSETRON HCL (PF) 4 MG/2 ML INJECTION SOLUTION
4.0000 mg | INTRAMUSCULAR | Status: AC
Start: 2022-09-19 — End: 2022-09-19
  Administered 2022-09-19: 4 mg via INTRAVENOUS
  Filled 2022-09-19: qty 2

## 2022-09-19 NOTE — ED Nurses Note (Signed)
Patient discharged home with family.  AVS reviewed with patient/care giver.  A written copy of the AVS and discharge instructions was given to the patient/care giver.  Questions sufficiently answered as needed.  Patient/care giver encouraged to follow up with PCP as indicated.  In the event of an emergency, patient/care giver instructed to call 911 or go to the nearest emergency room.

## 2022-09-19 NOTE — ED Provider Notes (Signed)
[facility] - Emergency Department  ED PROVIDER: Con Memos, FNP-BC     Name: Rance Muir  Age and Gender: 27 y.o. male  Date of Birth: 04-16-1996  Date or Service: 09/19/2022  MRN: [id number]  PCP: Cecilie Kicks, DO    [id number] Pain    HPI:  Matthew Frye is a 27 y.o. male who presents to the emergency department with complaints of abdominal pain and low back pain x 2 weeks. York Spaniel he was initially concerned his symptoms may be due to an STI but said he has been tested twice with no abnormal results. Denies any penile drainage or pain, dysuria or scrotal pain or swelling.      REVIEW OF SYSTEMS:   Constitutional: Denies fever or chills.  HENT: Denies ear, nose or throat pain.   Eyes: Denies any visual disturbances.  Cardiovascular: Denies chest pain.  Respiratory: Denies cough, congestion or shortness or breath.  Neurological: Denies headaches or dizziness.  Gastrointestinal: See HPI   Genitourinary: Denies any current urinary complaints.   Musculoskeletal: No injuries.  Skin: Denies any rashes.    Physical Exam:     ED Triage Vitals [09/19/22 1525]   BP (Non-Invasive) (!) 154/98   Heart Rate 71   Respiratory Rate 18   Temperature 36.7 C (98.1 F)   SpO2 100 %   Weight 68 kg (150 lb)   Height 1.753 m (5\' 9" )     Nursing note and vitals reviewed.    PHYSICAL EXAM:    General Details - well-developed, well-nourished, well-groomed, well-oriented, awake, alert. In no acute distress  Head Details - normocephalic, no apparent injury  Neck Details - supple   Eyes Details - EOMI, pupils PEARRL bilaterally  ENMT Details - both external ears normal  Appearance/Alertness/Orientation - appropriate, fully alert, oriented to person, place, time and situation  Respiratory Details - airway normal, equal chest wall expansion, respirations easy on room air, rate WNL, breath sounds clear  Cardiovascular - heart rhythm regular, normal rate, no edema, no murmur   Abdomen - no distention, bowel sounds normoactive, generalized  tenderness with palpation, no rebound or guarding, no CVA tenderness  Neuro/Psych Details - no acute motor or sensory deficits  Musculoskeletal - normal strength, no swelling or obvious signs of injury  Skin Details - skin pink, warm and dry      BELOW IS PERTINENT INFORMATION REVIEWED WITH PATIENT AND/OR EMR. CHANGES AS DOCUMENTED.    PMHx:    Past Medical History:   Diagnosis Date    Schizophrenia (CMS HCC)         PSHx:      Allergies:    No Known Allergies Social History  Social History     Tobacco Use    Smoking status: Some Days    Smokeless tobacco: Never    Tobacco comments:     Uses Nicotine gum   Vaping Use    Vaping status: Some Days   Substance Use Topics    Alcohol use: Yes     Comment: rare    Drug use: Yes     Types: Marijuana       Family History  Family Medical History:    None            Home Meds:      Discharge Medication List as of 09/19/2022  5:43 PM        CONTINUE these medications which have NOT CHANGED    Details   meloxicam (MOBIC) 15  mg Oral Tablet Take 1 Tablet (15 mg total) by mouth Once a day, Disp-30 Tablet, R-3, E-Rx      paliperidone palmitate (INVEGA SUSTENNA IM) Inject into the muscle, Historical Med                  RESULTS AND WORK-UP    Laboratory Results:      Labs Ordered/Reviewed   COMPREHENSIVE METABOLIC PANEL, NON-FASTING - Abnormal; Notable for the following components:       Result Value    CALCIUM 10.4 (*)     All other components within normal limits    Narrative:     Estimated Glomerular Filtration Rate (eGFR) is calculated using the CKD-EPI (2021) equation, intended for patients 51 years of age and older. If gender is not documented or "unknown", there will be no eGFR calculation.   URINALYSIS, MACRO/MICRO - Abnormal; Notable for the following components:    KETONES 20 (*)     UROBILINOGEN 2.0 (*)     All other components within normal limits   CBC WITH DIFF - Abnormal; Notable for the following components:    NEUTROPHIL % 78 (*)     LYMPHOCYTE % 15 (*)     NEUTROPHIL  # 7.70 (*)     All other components within normal limits   MAGNESIUM - Normal   LIPASE - Normal   URINALYSIS WITH REFLEX MICROSCOPIC AND CULTURE IF POSITIVE    Narrative:     The following orders were created for panel order URINALYSIS WITH REFLEX MICROSCOPIC AND CULTURE IF POSITIVE.  Procedure                               Abnormality         Status                     ---------                               -----------         ------                     URINALYSIS, MACRO/MICRO[[id number]]      Abnormal            Final result                 Please view results for these tests on the individual orders.   CBC/DIFF    Narrative:     The following orders were created for panel order CBC/DIFF.  Procedure                               Abnormality         Status                     ---------                               -----------         ------                     CBC WITH UXLK[[id number]]  Abnormal            Final result                 Please view results for these tests on the individual orders.        Radiographical Imaging:       CT ABDOMEN PELVIS W IV CONTRAST   Final Result by Edi, Radresults In (05/11 1719)   Some prominent lymph nodes in the mesentery in the right lower quadrant could be seen in mesenteric adenitis.          One or more dose reduction techniques were used (e.g., Automated exposure control, adjustment of the mA and/or kV according to patient size, use of iterative reconstruction technique).         Radiologist location ID: [id number]                Medications Given While In The ED:     EKG:     Medications Administered in the ED   ketorolac (TORADOL) 30 mg/mL injection (15 mg Intravenous Given 09/19/22 1608)   ondansetron (ZOFRAN) 2 mg/mL injection (4 mg Intravenous Given 09/19/22 1609)   iopamidol (ISOVUE-300) 61% infusion (75 mL Intravenous Given 09/19/22 1714)              MEDICAL DECISION MAKING AND CLINICAL COURSE    Discussed all results with patient. Patient advised to  maintain a clear liquid diet for 24-48 hours then slowly reintroduce bland foods, as tolerated. Recommend anti-inflammatories, as needed, for pain. Explained this condition is usually self-limiting and typically resolved without any major interventions.     MDM/Course:  Medical Decision Making  Amount and/or Complexity of Data Reviewed  External Data Reviewed: labs and radiology.  Labs: ordered.  Radiology: ordered.    Risk  Prescription drug management.        FOLLOW UP:  Patient advised to follow-up with PCP, specialist or return to the emergency department with any new or worsening symptoms.    Medications Prescribed:   Discharge Medication List as of 09/19/2022  5:43 PM          CLINICAL IMPRESSION:   Clinical Impression   Abdominal pain, unspecified abdominal location (Primary)   Mesenteric adenitis       DISPOSITION: Discharged    Cecilie Kicks, DO  [address]  [place] [address]  [phone]    Call in 1 day        Karie Fetch, APRN,FNP-BC

## 2022-09-19 NOTE — ED Triage Notes (Signed)
Patient presents to [facility] ER for evaluation of ongoing abdominal pain/lower back pain. He reports he thought it was an STD and has been tested twice but states he has been negative both time. Patient denies any NVD or fever.

## 2022-09-20 LAB — URINE HOLD

## 2022-09-21 ENCOUNTER — Telehealth: Payer: Self-pay

## 2022-09-21 NOTE — Transitions of Care (Post Inpatient/ED Visit) (Signed)
I spoke with pt; pt was seen at [facility] ED and pt has recently moved back to [facility]. Pt said he has appt to see his PCP in [place] on 09/23/22. Pt was seen in ED for abd pain and did not receive any meds to take home with him. Pt grandmother can support pt if needed. UC & ED precautions given and pt voiced understanding. Sending note to Allayne Gitelman NP.      09/21/2022  Name: Richard Newton MRN: [id number] DOB: Dec 13, 1995  Today's TOC FU Call Status: Today's TOC FU Call Status:: Successful TOC FU Call Competed Unsuccessful Call (1st Attempt) Date: 09/21/22 [facility] FU Call Complete Date: 09/21/22  Transition Care Management Follow-up Telephone Call Date of Discharge: 09/19/22 Discharge Facility:  [facility] ED on 09/19/22; pt recently moved back to [facility].) Type of Discharge: Emergency Department Reason for ED Visit:  (abd pain) How have you been since you were released from the hospital?: Same Any questions or concerns?: No  Items Reviewed: Did you receive and understand the discharge instructions provided?: Yes Dietary orders reviewed?: NA Do you have support at home?: Yes People in Home: grandparent(s) Name of Support/Comfort Primary Source: Darel Hong  Medications Reviewed Today: Medications Reviewed Today     Reviewed by Valeria Batman, MD (Physician) on 11/14/20 at 1644  Med List Status: <None>   Medication Order Taking? Sig Documenting Provider Last Dose Status Informant  ARIPiprazole (ABILIFY) 20 MG tablet [id number]  Take 1 tablet (20 mg total) by mouth at bedtime. Neysa Hotter, MD  Expired 09/27/20 2359   ARIPiprazole (ABILIFY) 30 MG tablet [id number]  Take 0.5 tablets (15 mg total) by mouth daily. Doreene Nest, NP  Active             Home Care and Equipment/Supplies: Were Home Health Services Ordered?: NA Any new equipment or medical supplies ordered?: NA  Functional Questionnaire: Do you need assistance with bathing/showering or  dressing?: No Do you need assistance with meal preparation?: No Do you need assistance with eating?: No Do you have difficulty maintaining continence: No Do you need assistance with getting out of bed/getting out of a chair/moving?: No Do you have difficulty managing or taking your medications?: No  Follow up appointments reviewed: PCP Follow-up appointment confirmed?: Yes Date of PCP follow-up appointment?: 09/23/22 Follow-up Provider: pt has PCP in [facility]. pt has recently moved back to [facility] Follow-up appointment confirmed?: NA Do you need transportation to your follow-up appointment?: No Do you understand care options if your condition(s) worsen?: Yes-patient verbalized understanding    SIGNATURE Lewanda Rife, LPN

## 2022-09-21 NOTE — Telephone Encounter (Signed)
Noted  

## 2022-09-21 NOTE — Transitions of Care (Post Inpatient/ED Visit) (Signed)
Unable to reach pt by phone and left v/m requesting pt to cb [phone]. Pt established with Allayne Gitelman NP 04/15/20 with acute visit on 11/08/20.       09/21/2022  Name: Richard Newton MRN: [id number] DOB: [birth year]  Today's TOC FU Call Status: Today's TOC FU Call Status:: Unsuccessul Call (1st Attempt) Unsuccessful Call (1st Attempt) Date: 09/21/22  Attempted to reach the patient regarding the most recent Inpatient/ED visit.  Follow Up Plan: Additional outreach attempts will be made to reach the patient to complete the Transitions of Care (Post Inpatient/ED visit) call.   Signature Lewanda Rife, LPN

## 2022-09-23 ENCOUNTER — Other Ambulatory Visit: Payer: Self-pay

## 2022-09-23 ENCOUNTER — Encounter (INDEPENDENT_AMBULATORY_CARE_PROVIDER_SITE_OTHER): Payer: Self-pay | Admitting: Family Medicine

## 2022-09-23 ENCOUNTER — Ambulatory Visit: Payer: 59 | Attending: Family Medicine | Admitting: Family Medicine

## 2022-09-23 VITALS — BP 120/88 | HR 63 | Temp 98.4°F | Ht 69.0 in | Wt 163.8 lb

## 2022-09-23 DIAGNOSIS — R002 Palpitations: Secondary | ICD-10-CM

## 2022-09-23 DIAGNOSIS — F17200 Nicotine dependence, unspecified, uncomplicated: Secondary | ICD-10-CM
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
## 2022-09-24 ENCOUNTER — Telehealth (INDEPENDENT_AMBULATORY_CARE_PROVIDER_SITE_OTHER): Payer: Self-pay | Admitting: Family Medicine

## 2022-09-24 NOTE — Telephone Encounter (Signed)
09/24/22 pc spoke with Jackalyn Lombard gave appt date and time for 3 DAY EXTENDED HOLTER MONITOR tue 5/21/24at 1:30 pm arrive at 1pm [facility] dhilderbrand

## 2022-09-25 NOTE — Addendum Note (Signed)
Addended by: Theda Sers on: 09/25/2022 08:31 AM     Modules accepted: Level of Service

## 2022-09-29 ENCOUNTER — Inpatient Hospital Stay
Admission: RE | Admit: 2022-09-29 | Discharge: 2022-09-29 | Disposition: A | Discharge status: Home / Self Care | Payer: 59 | Source: Ambulatory Visit | Attending: Family Medicine | Admitting: Family Medicine

## 2022-09-29 ENCOUNTER — Other Ambulatory Visit: Payer: Self-pay

## 2022-09-29 DIAGNOSIS — R002 Palpitations: Secondary | ICD-10-CM | POA: Insufficient documentation

## 2022-10-08 DIAGNOSIS — I491 Atrial premature depolarization: Secondary | ICD-10-CM

## 2022-10-08 DIAGNOSIS — I493 Ventricular premature depolarization: Secondary | ICD-10-CM

## 2022-10-08 DIAGNOSIS — R002 Palpitations: Secondary | ICD-10-CM

## 2022-10-08 LAB — 3 DAY EXTENDED HOLTER MONITOR
Heart rate (average): 74 {beats}/min
Isolated SVE count: 83 episodes
Isolated VE Counts: 22 episodes

## 2022-10-12 ENCOUNTER — Telehealth (INDEPENDENT_AMBULATORY_CARE_PROVIDER_SITE_OTHER): Payer: Self-pay | Admitting: Family Medicine

## 2022-10-12 NOTE — Telephone Encounter (Signed)
Patient notified. 10/12/22 tjm

## 2022-10-15 ENCOUNTER — Encounter (INDEPENDENT_AMBULATORY_CARE_PROVIDER_SITE_OTHER): Payer: Self-pay | Admitting: Family Medicine

## 2022-11-23 ENCOUNTER — Encounter (INDEPENDENT_AMBULATORY_CARE_PROVIDER_SITE_OTHER): Payer: Self-pay | Admitting: Family Medicine

## 2022-12-22 ENCOUNTER — Ambulatory Visit: Payer: 59 | Attending: Family Medicine | Admitting: Family Medicine

## 2022-12-22 ENCOUNTER — Other Ambulatory Visit: Payer: Self-pay

## 2022-12-22 ENCOUNTER — Ambulatory Visit (HOSPITAL_BASED_OUTPATIENT_CLINIC_OR_DEPARTMENT_OTHER): Payer: 59

## 2022-12-22 ENCOUNTER — Encounter (INDEPENDENT_AMBULATORY_CARE_PROVIDER_SITE_OTHER): Payer: Self-pay | Admitting: Family Medicine

## 2022-12-22 VITALS — BP 128/80 | HR 58 | Temp 97.4°F | Resp 12 | Ht 69.0 in | Wt 160.8 lb

## 2022-12-22 DIAGNOSIS — F17200 Nicotine dependence, unspecified, uncomplicated: Secondary | ICD-10-CM | POA: Insufficient documentation

## 2022-12-22 DIAGNOSIS — R109 Unspecified abdominal pain: Secondary | ICD-10-CM | POA: Insufficient documentation

## 2022-12-22 DIAGNOSIS — F17210 Nicotine dependence, cigarettes, uncomplicated: Secondary | ICD-10-CM

## 2022-12-22 DIAGNOSIS — I880 Nonspecific mesenteric lymphadenitis: Secondary | ICD-10-CM | POA: Insufficient documentation

## 2022-12-22 LAB — COMPREHENSIVE METABOLIC PNL, FASTING
ALBUMIN: 4.2 g/dL (ref 3.5–5.0)
ALKALINE PHOSPHATASE: 62 U/L (ref 45–115)
ALT (SGPT): 15 U/L (ref 10–55)
ANION GAP: 9 mmol/L (ref 4–13)
AST (SGOT): 22 U/L (ref 8–45)
BILIRUBIN TOTAL: 0.9 mg/dL (ref 0.3–1.3)
BUN/CREA RATIO: 7 (ref 6–22)
BUN: 7 mg/dL — ABNORMAL LOW (ref 8–25)
CALCIUM: 9.6 mg/dL (ref 8.6–10.2)
CHLORIDE: 104 mmol/L (ref 96–111)
CO2 TOTAL: 26 mmol/L (ref 22–30)
CREATININE: 1.06 mg/dL (ref 0.75–1.35)
ESTIMATED GFR - MALE: >90 mL/min/BSA (ref >=60)
GLUCOSE: 86 mg/dL (ref 70–99)
POTASSIUM: 3.6 mmol/L (ref 3.5–5.1)
PROTEIN TOTAL: 7.4 g/dL (ref 6.4–8.3)
SODIUM: 139 mmol/L (ref 136–145)

## 2022-12-22 LAB — LIPID PANEL
CHOL/HDL RATIO: 2.3
CHOLESTEROL: 168 mg/dL (ref 100–200)
HDL CHOL: 73 mg/dL (ref >=50)
LDL CALC: 84 mg/dL (ref <100)
NON-HDL: 95 mg/dL (ref <=190)
TRIGLYCERIDES: 52 mg/dL (ref <150)
VLDL CALC: 8 mg/dL (ref <30)

## 2022-12-22 LAB — CBC WITH DIFF
BASOPHIL #: 0 10*3/uL (ref 0.00–0.30)
BASOPHIL %: 1 % (ref 0–1)
EOSINOPHIL #: 0.2 10*3/uL (ref 0.00–0.50)
EOSINOPHIL %: 3 % (ref 0–4)
HCT: 45.6 % (ref 42.0–52.0)
HGB: 15.3 g/dL (ref 13.5–18.0)
LYMPHOCYTE #: 1.6 10*3/uL (ref 0.90–4.80)
LYMPHOCYTE %: 32 % (ref 23–35)
MCH: 30.3 pg (ref 28.0–33.0)
MCHC: 33.6 g/dL (ref 32.0–37.0)
MCV: 90.1 fL (ref 78.0–100.0)
MONOCYTE #: 0.4 10*3/uL (ref 0.30–0.90)
MONOCYTE %: 8 % (ref 0–12)
NEUTROPHIL #: 3 10*3/uL (ref 1.70–7.00)
NEUTROPHIL %: 57 % (ref 50–70)
PLATELETS: 217 10*3/uL (ref 130–400)
RBC: 5.06 10*6/uL (ref 4.70–5.40)
RDW: 13 % (ref 9.9–16.5)
WBC: 5.2 10*3/uL (ref 4.5–11.0)

## 2022-12-22 LAB — SCAN DIFFERENTIAL
PLATELET CLUMPS: ABSENT
PLATELET ESTIMATE: ADEQUATE
RBC MORPHOLOGY COMMENT: NORMAL
WBC MORPHOLOGY COMMENT: NORMAL

## 2022-12-22 LAB — THYROXINE, FREE (FREE T4): THYROXINE (T4), FREE: 0.94 ng/dL (ref 0.70–1.48)

## 2022-12-22 LAB — THYROID STIMULATING HORMONE (SENSITIVE TSH): TSH: 0.992 u[IU]/mL (ref 0.350–4.940)

## 2022-12-22 LAB — MAGNESIUM: MAGNESIUM: 1.9 mg/dL (ref 1.8–2.6)

## 2022-12-22 MED ORDER — FAMOTIDINE 20 MG TABLET
20.0000 mg | ORAL_TABLET | Freq: Every evening | ORAL | 1 refills | Status: AC
Start: 2022-12-22 — End: ?

## 2022-12-22 NOTE — Progress Notes (Signed)
FAMILY MEDICINE, [facility]  [address]   [place] [address]  Operated by [facility]     Name: Matthew Frye  MRN:  [id number]       Date: 12/22/2022  DOB:    Sep 24, 1995  Age: 27 y.o.         Reason for Visit: Follow Up (Continuing stomach issues)    History of Present Illness  Matthew Frye is a 27 y.o. male who is being seen today for follow-up continued abdominal pain.  Reviewed CT scan with patient. .  Did show some mesenteric adenitis.  Denies weight loss appetite change denies nausea vomiting denies fever chills denies diarrhea constipation or flank tenderness.       Past Medical History:   Diagnosis Date    Schizophrenia (CMS HCC)                Current Outpatient Medications   Medication Sig    famotidine (PEPCID) 20 mg Oral Tablet Take 1 Tablet (20 mg total) by mouth Every evening    meloxicam (MOBIC) 15 mg Oral Tablet Take 1 Tablet (15 mg total) by mouth Once a day (Patient not taking: Reported on 09/23/2022)    paliperidone palmitate (INVEGA SUSTENNA IM) Inject into the muscle (Patient not taking: Reported on 07/09/2022)     No Known Allergies  Family Medical History:    None         Social History     Tobacco Use    Smoking status: Some Days    Smokeless tobacco: Never    Tobacco comments:     Uses Nicotine gum   Vaping Use    Vaping status: Some Days   Substance Use Topics    Alcohol use: Yes     Comment: rare    Drug use: Yes     Types: Marijuana       Nursing note  There are no exam notes on file for this visit.     Review of Systems  Review of Systems   Constitutional: Negative.    HENT: Negative.     Eyes: Negative.    Respiratory: Negative.     Cardiovascular: Negative.    Gastrointestinal:  Positive for abdominal pain.   Genitourinary: Negative.    Musculoskeletal: Negative.    Skin: Negative.    Neurological: Negative.    Endo/Heme/Allergies: Negative.    Psychiatric/Behavioral: Negative.         Physical Exam:  BP 128/80 (Site: Left Arm, Patient  Position: Sitting, Cuff Size: Adult)   Pulse 58   Temp 36.3 C (97.4 F)   Resp 12   Ht 1.753 m (5\' 9" )   Wt 72.9 kg (160 lb 12.8 oz)   SpO2 98%   BMI 23.75 kg/m       Physical Exam  HENT:      Head: Normocephalic.   Eyes:      Conjunctiva/sclera: Conjunctivae normal.      Pupils: Pupils are equal, round, and reactive to light.   Cardiovascular:      Rate and Rhythm: Normal rate and regular rhythm.      Heart sounds: Normal heart sounds.   Pulmonary:      Effort: Pulmonary effort is normal.      Breath sounds: Normal breath sounds.   Abdominal:      General: Bowel sounds are normal.      Palpations: Abdomen is soft.  Comments: Abdomen soft no rebound no guarding no masses no organomegaly bowel sounds x4 quadrants.    Musculoskeletal:         General: Normal range of motion.      Cervical back: Normal range of motion and neck supple.   Skin:     General: Skin is warm.   Neurological:      Mental Status: He is alert and oriented to person, place, and time.      Gait: Gait is intact.   Psychiatric:         Mood and Affect: Mood and affect normal.         Cognition and Memory: Memory normal.         Judgment: Judgment normal.         Assessment and Plan    ENCOUNTER DIAGNOSES     ICD-10-CM   1. Abdominal pain, unspecified abdominal location  R10.9   2. Mesenteric adenitis  I88.0        Orders Placed This Encounter    CT Abdomen WO    CBC/DIFF    COMPREHENSIVE METABOLIC PNL, FASTING    LIPID PANEL    URINALYSIS, MACROSCOPIC AND MICROSCOPIC    TSH Sensitive    Thyroxine Free (Free T4)    Magnesium    Referral to External Provider (AMB)    famotidine (PEPCID) 20 mg Oral Tablet    Lab work outpatient will do follow-up CT abdomen can patient into see a gastroenterologist for evaluation try some Pepcid prescription once a day continue watch diet closely answered questions for now. On the day of the encounter, a total of 30 minutes was spent on this patient encounter including review of historical information,  examination, documentation and post-visit activities. The time documented excludes procedural time.     Follow up: Return in about 3 months (around 03/24/2023).    Tanasha Menees, DO  12/22/2022, 17:02

## 2022-12-23 NOTE — Addendum Note (Signed)
Addended by: Theda Sers on: 12/23/2022 11:04 AM     Modules accepted: Level of Service

## 2022-12-23 NOTE — Addendum Note (Signed)
Addended by: Theda Sers on: 12/23/2022 10:16 AM     Modules accepted: Level of Service

## 2023-01-04 ENCOUNTER — Telehealth (INDEPENDENT_AMBULATORY_CARE_PROVIDER_SITE_OTHER): Payer: Self-pay | Admitting: Family Medicine

## 2023-01-04 NOTE — Telephone Encounter (Signed)
01/04/23 vm from linda at dr. Cameron Proud office we have spoken with pt and does not wish to scheduled appt. he states he will be  moving out of [place]  before we can schedule appt  OCT. Dhilderbrand  dr, Jamesetta [place]

## 2023-01-04 NOTE — Telephone Encounter (Signed)
01/04/23 pc spoke with jaben gave date and time for CT ABDOMEN WO IV CONTRAST thu 01/07/23 at 4:00 pm arrive at 3:30 pm reynolds dhilderbrand

## 2023-01-06 ENCOUNTER — Other Ambulatory Visit (INDEPENDENT_AMBULATORY_CARE_PROVIDER_SITE_OTHER): Payer: Self-pay | Admitting: Family Medicine

## 2023-01-06 DIAGNOSIS — I880 Nonspecific mesenteric lymphadenitis: Secondary | ICD-10-CM

## 2023-01-06 DIAGNOSIS — R109 Unspecified abdominal pain: Secondary | ICD-10-CM

## 2023-01-07 ENCOUNTER — Ambulatory Visit
Admission: RE | Admit: 2023-01-07 | Discharge: 2023-01-07 | Disposition: A | Discharge status: Home / Self Care | Payer: 59 | Source: Ambulatory Visit | Attending: Family Medicine | Admitting: Family Medicine

## 2023-01-07 ENCOUNTER — Other Ambulatory Visit: Payer: Self-pay

## 2023-01-07 ENCOUNTER — Other Ambulatory Visit: Payer: 59

## 2023-01-07 DIAGNOSIS — I880 Nonspecific mesenteric lymphadenitis: Secondary | ICD-10-CM

## 2023-01-07 DIAGNOSIS — R109 Unspecified abdominal pain: Secondary | ICD-10-CM | POA: Insufficient documentation

## 2023-01-07 MED ORDER — IOPAMIDOL 300 MG IODINE/ML (61 %) INTRAVENOUS SOLUTION
75.0000 mL | INTRAVENOUS | Status: AC
Start: 2023-01-07 — End: 2023-01-07
  Administered 2023-01-07: 75 mL via INTRAVENOUS

## 2023-01-07 MED ORDER — DIATRIZOATE MEGLUMINE-DIATRIZOATE SODIUM 66 %-10 % ORAL SOLUTION
30.0000 mL | ORAL | Status: DC
Start: 2023-01-07 — End: 2023-01-08

## 2023-02-19 IMAGING — DX DG SHOULDER 2+V*L*
3 series · 3 of 3 positions shown · non-contrast
Comparison: None.

CLINICAL DATA: Chronic shoulder pain. Chronic pain of left shoulder
radiating to the upper extremity.

EXAM:
LEFT SHOULDER - 2+ VIEW

[shoulder axial]
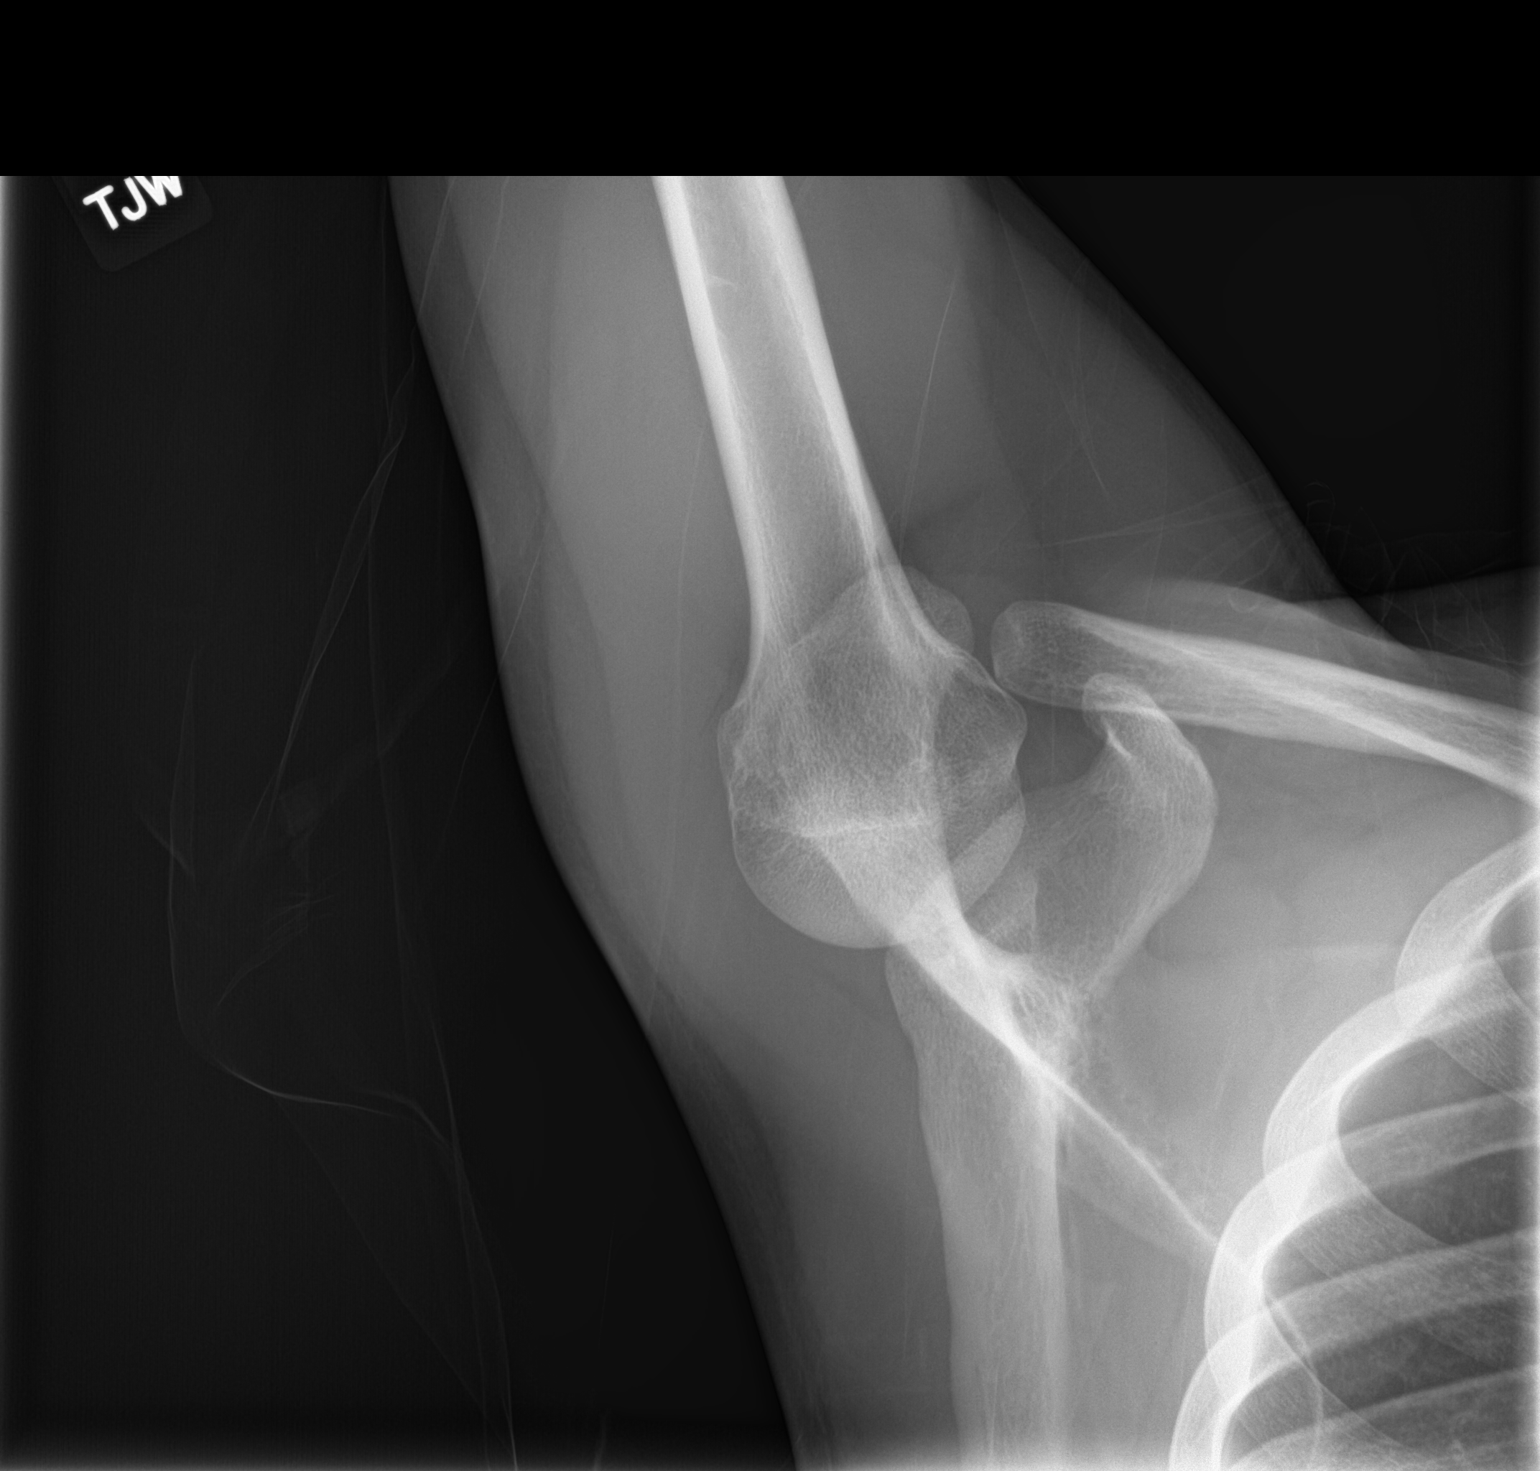

[shoulder ap]
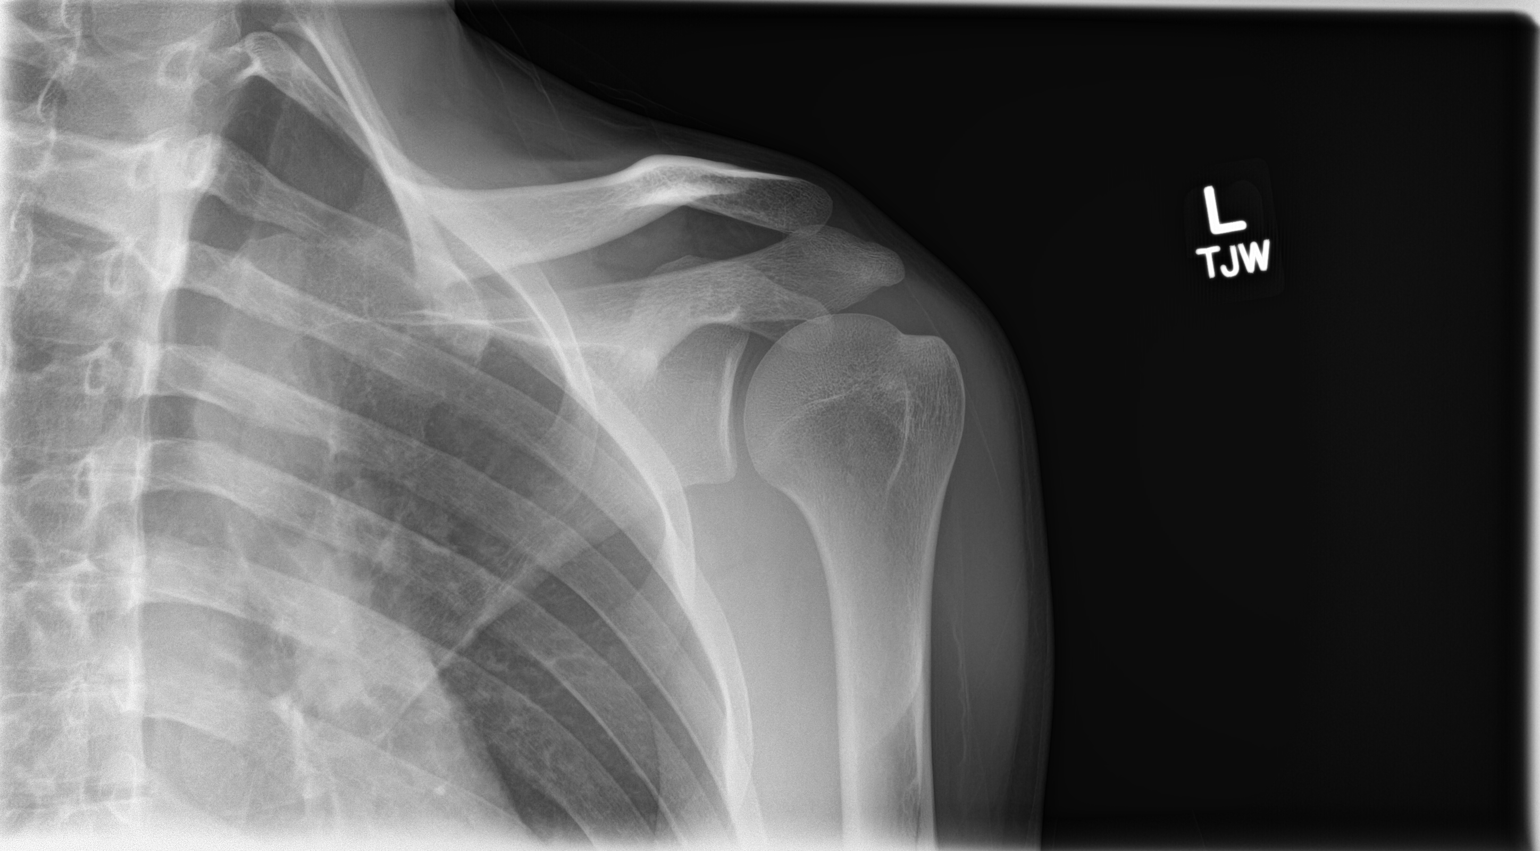

[shoulder y-view]
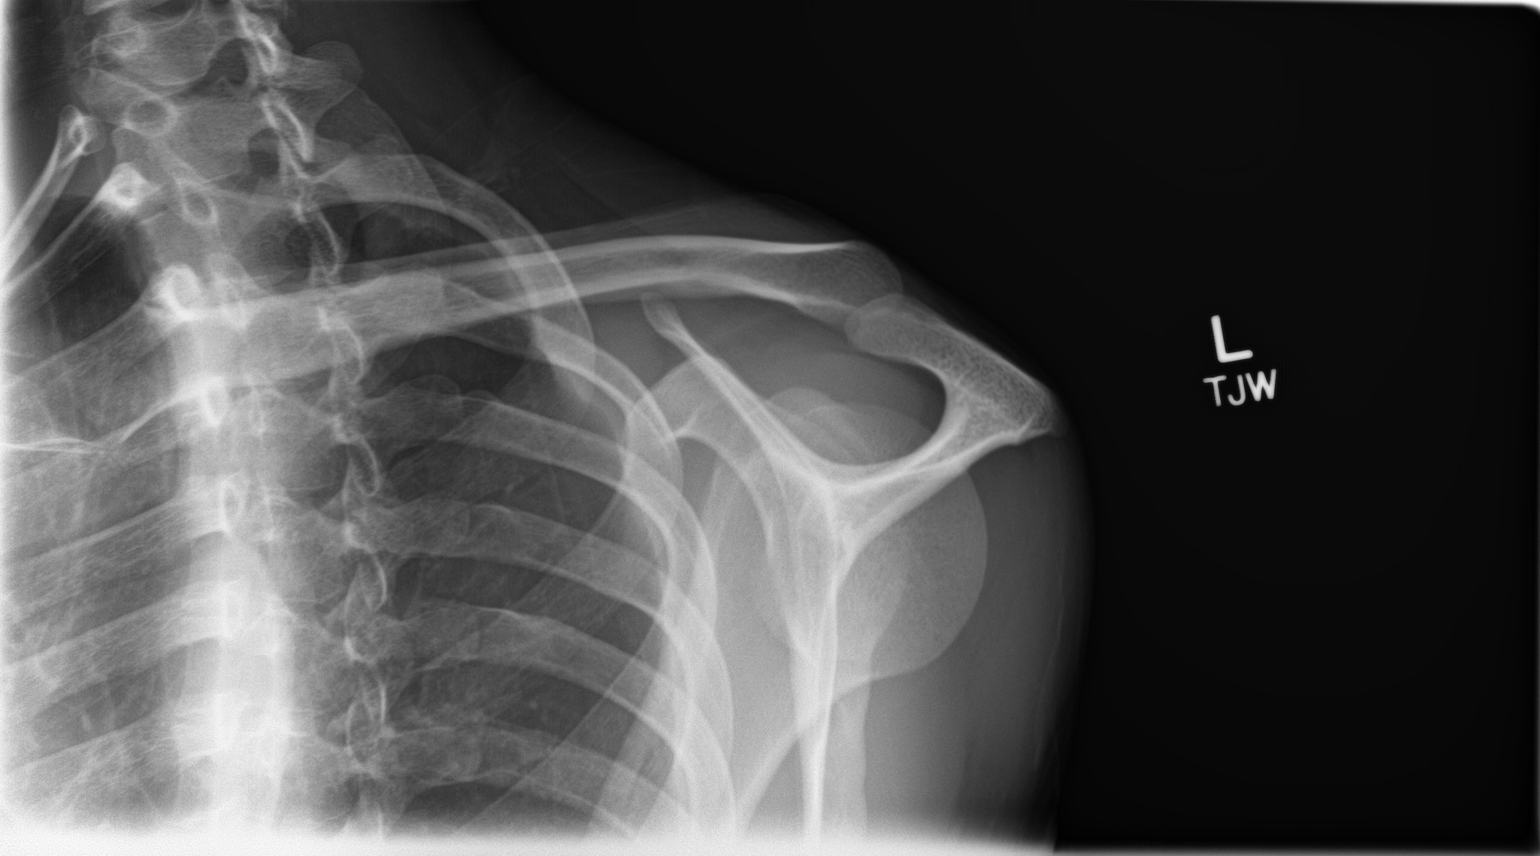

[3 of 3 positions shown; findings below may reference images not displayed]

FINDINGS: There is no evidence of fracture or dislocation. Normal joint spaces
and alignment. There is no evidence of arthropathy or other focal
bone abnormality. Soft tissues are unremarkable.
IMPRESSION: Negative radiographs of the left shoulder.
